# Patient Record
Sex: Female | Born: 1955 | Race: White | Hispanic: Yes | Marital: Married | Smoking: Never smoker
Health system: Southern US, Community
[De-identification: ages and names within clinical notes are randomized; demographics above are authoritative.]

## PROBLEM LIST (undated history)

## (undated) DIAGNOSIS — E119 Type 2 diabetes mellitus without complications: Secondary | ICD-10-CM

## (undated) DIAGNOSIS — Z862 Personal history of diseases of the blood and blood-forming organs and certain disorders involving the immune mechanism: Secondary | ICD-10-CM

## (undated) DIAGNOSIS — E785 Hyperlipidemia, unspecified: Secondary | ICD-10-CM

## (undated) DIAGNOSIS — K319 Disease of stomach and duodenum, unspecified: Secondary | ICD-10-CM

## (undated) DIAGNOSIS — D509 Iron deficiency anemia, unspecified: Secondary | ICD-10-CM

## (undated) DIAGNOSIS — D696 Thrombocytopenia, unspecified: Secondary | ICD-10-CM

## (undated) DIAGNOSIS — I10 Essential (primary) hypertension: Secondary | ICD-10-CM

## (undated) DIAGNOSIS — D649 Anemia, unspecified: Secondary | ICD-10-CM

## (undated) DIAGNOSIS — Z87898 Personal history of other specified conditions: Secondary | ICD-10-CM

## (undated) DIAGNOSIS — Z972 Presence of dental prosthetic device (complete) (partial): Secondary | ICD-10-CM

## (undated) DIAGNOSIS — K259 Gastric ulcer, unspecified as acute or chronic, without hemorrhage or perforation: Secondary | ICD-10-CM

## (undated) HISTORY — DX: Essential (primary) hypertension: I10

## (undated) HISTORY — PX: TOE SURGERY: SHX1073

## (undated) HISTORY — DX: Disease of stomach and duodenum, unspecified: K31.9

## (undated) HISTORY — DX: Gastric ulcer, unspecified as acute or chronic, without hemorrhage or perforation: K25.9

## (undated) HISTORY — DX: Hyperlipidemia, unspecified: E78.5

## (undated) HISTORY — DX: Type 2 diabetes mellitus without complications: E11.9

---

## 2006-02-14 ENCOUNTER — Ambulatory Visit: Payer: Self-pay | Admitting: Family Medicine

## 2006-02-19 ENCOUNTER — Ambulatory Visit: Payer: Self-pay

## 2006-03-06 ENCOUNTER — Ambulatory Visit: Payer: Self-pay | Admitting: *Deleted

## 2006-03-21 ENCOUNTER — Encounter: Payer: Self-pay | Admitting: Family Medicine

## 2006-04-14 ENCOUNTER — Encounter: Payer: Self-pay | Admitting: Family Medicine

## 2006-05-15 ENCOUNTER — Encounter: Payer: Self-pay | Admitting: Family Medicine

## 2006-06-15 ENCOUNTER — Encounter: Payer: Self-pay | Admitting: Family Medicine

## 2006-12-31 ENCOUNTER — Ambulatory Visit: Payer: Self-pay | Admitting: *Deleted

## 2007-01-01 ENCOUNTER — Ambulatory Visit (HOSPITAL_COMMUNITY): Admission: RE | Admit: 2007-01-01 | Discharge: 2007-01-01 | Payer: Self-pay | Admitting: *Deleted

## 2007-01-16 ENCOUNTER — Ambulatory Visit: Payer: Self-pay | Admitting: Cardiovascular Disease

## 2009-11-10 ENCOUNTER — Ambulatory Visit: Payer: Self-pay | Admitting: Family Medicine

## 2012-07-22 ENCOUNTER — Ambulatory Visit: Payer: Self-pay | Admitting: Family Medicine

## 2013-05-14 ENCOUNTER — Ambulatory Visit: Payer: Self-pay | Admitting: Podiatry

## 2013-05-14 ENCOUNTER — Ambulatory Visit: Payer: Self-pay | Admitting: Anesthesiology

## 2013-05-14 DIAGNOSIS — I10 Essential (primary) hypertension: Secondary | ICD-10-CM

## 2014-09-20 DIAGNOSIS — I771 Stricture of artery: Secondary | ICD-10-CM | POA: Insufficient documentation

## 2017-12-09 ENCOUNTER — Other Ambulatory Visit: Payer: Self-pay | Admitting: Family Medicine

## 2017-12-09 DIAGNOSIS — Z1239 Encounter for other screening for malignant neoplasm of breast: Secondary | ICD-10-CM

## 2018-01-03 ENCOUNTER — Ambulatory Visit
Admission: RE | Admit: 2018-01-03 | Discharge: 2018-01-03 | Disposition: A | Discharge status: Home / Self Care | Payer: No Typology Code available for payment source | Source: Ambulatory Visit | Attending: Family Medicine | Admitting: Family Medicine

## 2018-01-03 ENCOUNTER — Encounter: Payer: Self-pay | Admitting: Radiology

## 2018-01-03 DIAGNOSIS — Z1231 Encounter for screening mammogram for malignant neoplasm of breast: Secondary | ICD-10-CM | POA: Insufficient documentation

## 2018-01-03 DIAGNOSIS — Z1239 Encounter for other screening for malignant neoplasm of breast: Secondary | ICD-10-CM

## 2020-06-01 ENCOUNTER — Other Ambulatory Visit: Payer: Self-pay | Admitting: Family Medicine

## 2020-06-01 DIAGNOSIS — Z13820 Encounter for screening for osteoporosis: Secondary | ICD-10-CM

## 2020-06-10 ENCOUNTER — Other Ambulatory Visit: Payer: Self-pay | Admitting: Family Medicine

## 2020-06-10 DIAGNOSIS — G458 Other transient cerebral ischemic attacks and related syndromes: Secondary | ICD-10-CM

## 2020-09-19 ENCOUNTER — Other Ambulatory Visit: Payer: Self-pay | Admitting: Family Medicine

## 2020-09-19 DIAGNOSIS — Z1231 Encounter for screening mammogram for malignant neoplasm of breast: Secondary | ICD-10-CM

## 2020-10-21 ENCOUNTER — Ambulatory Visit
Admission: RE | Admit: 2020-10-21 | Discharge: 2020-10-21 | Disposition: A | Discharge status: Home / Self Care | Payer: Medicare Other | Source: Ambulatory Visit | Attending: Family Medicine | Admitting: Family Medicine

## 2020-10-21 ENCOUNTER — Other Ambulatory Visit: Payer: Self-pay

## 2020-10-21 DIAGNOSIS — Z1231 Encounter for screening mammogram for malignant neoplasm of breast: Secondary | ICD-10-CM | POA: Diagnosis present

## 2021-07-19 ENCOUNTER — Other Ambulatory Visit: Payer: Self-pay | Admitting: Physician Assistant

## 2021-07-19 DIAGNOSIS — Z13820 Encounter for screening for osteoporosis: Secondary | ICD-10-CM

## 2021-09-22 ENCOUNTER — Encounter (INDEPENDENT_AMBULATORY_CARE_PROVIDER_SITE_OTHER): Payer: Self-pay

## 2021-09-22 ENCOUNTER — Inpatient Hospital Stay: Payer: Medicare Other

## 2021-09-22 ENCOUNTER — Inpatient Hospital Stay: Payer: Medicare Other | Attending: Internal Medicine | Admitting: Internal Medicine

## 2021-09-22 ENCOUNTER — Encounter: Payer: Self-pay | Admitting: Internal Medicine

## 2021-09-22 ENCOUNTER — Other Ambulatory Visit: Payer: Self-pay

## 2021-09-22 VITALS — BP 104/59 | HR 72 | Temp 98.7°F | Wt 146.0 lb

## 2021-09-22 DIAGNOSIS — Z7982 Long term (current) use of aspirin: Secondary | ICD-10-CM | POA: Diagnosis not present

## 2021-09-22 DIAGNOSIS — D473 Essential (hemorrhagic) thrombocythemia: Secondary | ICD-10-CM | POA: Insufficient documentation

## 2021-09-22 DIAGNOSIS — Z86718 Personal history of other venous thrombosis and embolism: Secondary | ICD-10-CM | POA: Insufficient documentation

## 2021-09-22 DIAGNOSIS — R161 Splenomegaly, not elsewhere classified: Secondary | ICD-10-CM | POA: Diagnosis not present

## 2021-09-22 DIAGNOSIS — Z7984 Long term (current) use of oral hypoglycemic drugs: Secondary | ICD-10-CM | POA: Diagnosis not present

## 2021-09-22 DIAGNOSIS — D75839 Thrombocytosis, unspecified: Secondary | ICD-10-CM

## 2021-09-22 DIAGNOSIS — Z79899 Other long term (current) drug therapy: Secondary | ICD-10-CM | POA: Insufficient documentation

## 2021-09-22 DIAGNOSIS — E119 Type 2 diabetes mellitus without complications: Secondary | ICD-10-CM | POA: Insufficient documentation

## 2021-09-22 DIAGNOSIS — I10 Essential (primary) hypertension: Secondary | ICD-10-CM | POA: Diagnosis not present

## 2021-09-22 DIAGNOSIS — E785 Hyperlipidemia, unspecified: Secondary | ICD-10-CM | POA: Insufficient documentation

## 2021-09-22 LAB — CBC WITH DIFFERENTIAL/PLATELET
Abs Immature Granulocytes: 0.05 10*3/uL (ref 0.00–0.07)
Basophils Absolute: 0.1 10*3/uL (ref 0.0–0.1)
Basophils Relative: 1 %
Eosinophils Absolute: 0.5 10*3/uL (ref 0.0–0.5)
Eosinophils Relative: 5 %
HCT: 35.5 % — ABNORMAL LOW (ref 36.0–46.0)
Hemoglobin: 11.6 g/dL — ABNORMAL LOW (ref 12.0–15.0)
Immature Granulocytes: 1 %
Lymphocytes Relative: 19 %
Lymphs Abs: 1.7 10*3/uL (ref 0.7–4.0)
MCH: 27.6 pg (ref 26.0–34.0)
MCHC: 32.7 g/dL (ref 30.0–36.0)
MCV: 84.3 fL (ref 80.0–100.0)
Monocytes Absolute: 0.3 10*3/uL (ref 0.1–1.0)
Monocytes Relative: 4 %
Neutro Abs: 6.6 10*3/uL (ref 1.7–7.7)
Neutrophils Relative %: 70 %
Platelets: 773 10*3/uL — ABNORMAL HIGH (ref 150–400)
RBC: 4.21 MIL/uL (ref 3.87–5.11)
RDW: 15.3 % (ref 11.5–15.5)
WBC: 9.3 10*3/uL (ref 4.0–10.5)
nRBC: 0 % (ref 0.0–0.2)

## 2021-09-22 LAB — COMPREHENSIVE METABOLIC PANEL
ALT: 20 U/L (ref 0–44)
AST: 25 U/L (ref 15–41)
Albumin: 4 g/dL (ref 3.5–5.0)
Alkaline Phosphatase: 59 U/L (ref 38–126)
Anion gap: 10 (ref 5–15)
BUN: 20 mg/dL (ref 8–23)
CO2: 29 mmol/L (ref 22–32)
Calcium: 9.2 mg/dL (ref 8.9–10.3)
Chloride: 99 mmol/L (ref 98–111)
Creatinine, Ser: 0.9 mg/dL (ref 0.44–1.00)
GFR, Estimated: >60 mL/min (ref >60)
Glucose, Bld: 167 mg/dL — ABNORMAL HIGH (ref 70–99)
Potassium: 3.4 mmol/L — ABNORMAL LOW (ref 3.5–5.1)
Sodium: 138 mmol/L (ref 135–145)
Total Bilirubin: 0.4 mg/dL (ref 0.3–1.2)
Total Protein: 7.1 g/dL (ref 6.5–8.1)

## 2021-09-22 LAB — TECHNOLOGIST SMEAR REVIEW: Plt Morphology: INCREASED

## 2021-09-22 LAB — LACTATE DEHYDROGENASE: LDH: 190 U/L (ref 98–192)

## 2021-09-22 NOTE — Progress Notes (Signed)
Pt went to see md because she nauseated, no appetite, fever. She went to md and got an atb and she does not know the name of it she is still on it and after a few dates she was feeling better again

## 2021-09-22 NOTE — Assessment & Plan Note (Addendum)
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 Patient is asymptomatic.  Long discussion with the patient regarding potential cause of the abnormal blood counts-including but not limited to myeloproliferative neoplasm/chronic myeloid leukemia.  # For now I  recommend checking CBC;CMP jak 2 BCR ABL; MPL; CALR mutation on the peripheral blood.LDH;  Check peripheral smear. Also discussed regarding bone marrow biopsy with the above workup is inconclusive; however I would prefer not to do a bone marrow unless absolutely needed.  # I discussed the potential concerns for stroke with elevated platelets. Patient is on aspirin.   Thank you for allowing me to participate in the care of your pleasant patient. Please do not hesitate to contact me with questions or concerns in the interim.  # Patient follow-up with me in approximately 3 weeks to review the above results. All questions were answered. The patient knows to call the clinic with any problems, questions or concerns.  # DISPOSITION: # labs today # follow up in 3 weeks- MD; no labs-Dr.B

## 2021-09-22 NOTE — Progress Notes (Signed)
[facility] CONSULT NOTE  Patient Care Team: System, Provider Not In as PCP - General  CHIEF COMPLAINTS/PURPOSE OF CONSULTATION: Thrombocytosis.   HEMATOLOGY HISTORY  # THROMBOCYTOSIS [since 2008- Elevated platelets- 771 [2015]; Hb; white count]; NOV 2022- 810; Hb 12; WBC- WNL [PCP]  # Hx of Left LE DVT [in 9373]-    HISTORY OF PRESENTING ILLNESS: Ambulating independently.  Accompanied by son.  Interpreter. Virginia Esparza 65 y.o.  female pleasant patient was been referred to [place] for further evaluation of elevated platelets which was incidentally found on blood work.   Patient had a recent episode of URI treated with antibiotics.  On work-up patient noted to have elevated platelets up to 800.  Patient denies any history of blood clots or strokes. Denies any burning pain or discoloration in the fingertips or toes. Patient takes aspirin once a day.   Appetite is good . No weight loss or night sweats no recurrent fevers. No cough or shortness of breath or chest pain.     Review of Systems  Constitutional:  Negative for chills, diaphoresis, fever, malaise/fatigue and weight loss.  HENT:  Negative for nosebleeds and sore throat.   Eyes:  Negative for double vision.  Respiratory:  Negative for cough, hemoptysis, sputum production, shortness of breath and wheezing.   Cardiovascular:  Negative for chest pain, palpitations, orthopnea and leg swelling.  Gastrointestinal:  Negative for abdominal pain, blood in stool, constipation, diarrhea, heartburn, melena, nausea and vomiting.  Genitourinary:  Negative for dysuria, frequency and urgency.  Musculoskeletal:  Negative for back pain and joint pain.  Skin: Negative.  Negative for itching and rash.  Neurological:  Negative for dizziness, tingling, focal weakness, weakness and headaches.  Endo/Heme/Allergies:  Does not bruise/bleed easily.  Psychiatric/Behavioral:  Negative for depression. The patient is not nervous/anxious  and does not have insomnia.     MEDICAL HISTORY:  Past Medical History:  Diagnosis Date   Diabetes mellitus without complication (HCC)    prediabetic   Hyperlipemia    Hypertension     SURGICAL HISTORY: Past Surgical History:  Procedure Laterality Date   CESAREAN SECTION  1981   CESAREAN SECTION  1995    SOCIAL HISTORY: Social History   Socioeconomic History   Marital status: Married    Spouse name: Not on file   Number of children: Not on file   Years of education: Not on file   Highest education level: Not on file  Occupational History   Not on file  Tobacco Use   Smoking status: Never   Smokeless tobacco: Never  Vaping Use   Vaping Use: Never used  Substance and Sexual Activity   Alcohol use: Never   Drug use: Never   Sexual activity: Yes  Other Topics Concern   Not on file  Social History 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 Lives in [place]- with husband/son.    Social Determinants of Health   Financial Resource Strain: Not on file  Food Insecurity: Not on file  Transportation Needs: Not on file  Physical Activity: Not on file  Stress: Not on file  Social Connections: Not on file  Intimate Partner Violence: Not on file    FAMILY HISTORY: Family History  Problem Relation Age of Onset   Diabetes Mother     ALLERGIES:  has no allergies on file.  MEDICATIONS:  Current Outpatient Medications  Medication Sig Dispense Refill   aspirin 81 MG EC tablet Take 81 mg by mouth daily.  atorvastatin (LIPITOR) 40 MG tablet Take 40 mg by mouth daily as needed.     chlorthalidone (HYGROTON) 25 MG tablet Take 25 mg by mouth daily.     lisinopril (ZESTRIL) 40 MG tablet Take 40 mg by mouth daily.     metFORMIN (GLUCOPHAGE) 500 MG tablet Take 500 mg by mouth 2 (two) times daily.     No current facility-administered medications for this visit.     PHYSICAL EXAMINATION:   Vitals:   09/22/21 1418  BP: (!) 104/59  Pulse: 72   Temp: 98.7 F (37.1 C)   Filed Weights   09/22/21 1418  Weight: 146 lb (66.2 kg)    Physical Exam Vitals and nursing note reviewed.  HENT:     Head: Normocephalic and atraumatic.     Mouth/Throat:     Pharynx: Oropharynx is clear.  Eyes:     Extraocular Movements: Extraocular movements intact.     Pupils: Pupils are equal, round, and reactive to light.  Cardiovascular:     Rate and Rhythm: Normal rate and regular rhythm.  Pulmonary:     Comments: Decreased breath sounds bilaterally.  Abdominal:     Palpations: Abdomen is soft.  Musculoskeletal:        General: Normal range of motion.     Cervical back: Normal range of motion.  Skin:    General: Skin is warm.  Neurological:     General: No focal deficit present.     Mental Status: She is alert and oriented to person, place, and time.  Psychiatric:        Behavior: Behavior normal.        Judgment: Judgment normal.     LABORATORY DATA:  I have reviewed the data as listed Lab Results  Component Value Date   WBC 9.3 09/22/2021   HGB 11.6 (L) 09/22/2021   HCT 35.5 (L) 09/22/2021   MCV 84.3 09/22/2021   PLT 773 (H) 09/22/2021   Recent Labs    09/22/21 1515  NA 138  K 3.4*  CL 99  CO2 29  GLUCOSE 167*  BUN 20  CREATININE 0.90  CALCIUM 9.2  GFRNONAA >60  PROT 7.1  ALBUMIN 4.0  AST 25  ALT 20  ALKPHOS 59  BILITOT 0.4     No results found.  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 Patient is asymptomatic.  Long discussion with the patient regarding potential cause of the abnormal blood counts-including but not limited to myeloproliferative neoplasm/chronic myeloid leukemia.  # For now I  recommend checking CBC;CMP jak 2 BCR ABL; MPL; CALR mutation on the peripheral blood.LDH;  Check peripheral smear. Also discussed regarding bone marrow biopsy with the above workup is inconclusive; however I would prefer  not to do a bone marrow unless absolutely needed.  # I discussed the potential concerns for stroke with elevated platelets. Patient is on aspirin.   Thank you for allowing me to participate in the care of your pleasant patient. Please do not hesitate to contact me with questions or concerns in the interim.  # Patient follow-up with me in approximately 3 weeks to review the above results. All questions were answered. The patient knows to call the clinic with any problems, questions or concerns.  # DISPOSITION: # labs today # follow up in 3 weeks- MD; no labs-Dr.B       Cammie Sickle, MD 09/22/2021 8:12 PM

## 2021-09-25 LAB — BCR-ABL1 FISH
Cells Analyzed: 200
Cells Counted: 200

## 2021-09-28 LAB — CALRETICULIN (CALR) MUTATION ANALYSIS

## 2021-09-29 ENCOUNTER — Ambulatory Visit: Payer: Medicare Other

## 2021-09-29 LAB — JAK2 GENOTYPR

## 2021-10-03 LAB — MPL MUTATION ANALYSIS

## 2021-10-13 ENCOUNTER — Ambulatory Visit: Payer: Medicare Other | Admitting: Internal Medicine

## 2021-10-18 ENCOUNTER — Other Ambulatory Visit: Payer: Self-pay

## 2021-10-18 ENCOUNTER — Ambulatory Visit
Admission: RE | Admit: 2021-10-18 | Discharge: 2021-10-18 | Disposition: A | Discharge status: Home / Self Care | Payer: Medicare Other | Source: Ambulatory Visit | Attending: Internal Medicine | Admitting: Internal Medicine

## 2021-10-18 DIAGNOSIS — D75839 Thrombocytosis, unspecified: Secondary | ICD-10-CM | POA: Insufficient documentation

## 2021-10-18 DIAGNOSIS — R161 Splenomegaly, not elsewhere classified: Secondary | ICD-10-CM | POA: Insufficient documentation

## 2021-10-20 ENCOUNTER — Inpatient Hospital Stay: Payer: Medicare Other | Attending: Internal Medicine | Admitting: Internal Medicine

## 2021-10-20 ENCOUNTER — Ambulatory Visit: Payer: Medicare Other | Admitting: Internal Medicine

## 2021-10-20 ENCOUNTER — Encounter: Payer: Self-pay | Admitting: Internal Medicine

## 2021-10-20 ENCOUNTER — Other Ambulatory Visit: Payer: Self-pay

## 2021-10-20 DIAGNOSIS — Z7984 Long term (current) use of oral hypoglycemic drugs: Secondary | ICD-10-CM | POA: Diagnosis not present

## 2021-10-20 DIAGNOSIS — E785 Hyperlipidemia, unspecified: Secondary | ICD-10-CM | POA: Insufficient documentation

## 2021-10-20 DIAGNOSIS — I10 Essential (primary) hypertension: Secondary | ICD-10-CM | POA: Diagnosis not present

## 2021-10-20 DIAGNOSIS — Z86718 Personal history of other venous thrombosis and embolism: Secondary | ICD-10-CM | POA: Diagnosis not present

## 2021-10-20 DIAGNOSIS — Z79899 Other long term (current) drug therapy: Secondary | ICD-10-CM | POA: Diagnosis not present

## 2021-10-20 DIAGNOSIS — R161 Splenomegaly, not elsewhere classified: Secondary | ICD-10-CM | POA: Diagnosis not present

## 2021-10-20 DIAGNOSIS — Z7982 Long term (current) use of aspirin: Secondary | ICD-10-CM | POA: Insufficient documentation

## 2021-10-20 DIAGNOSIS — D473 Essential (hemorrhagic) thrombocythemia: Secondary | ICD-10-CM | POA: Insufficient documentation

## 2021-10-20 DIAGNOSIS — E119 Type 2 diabetes mellitus without complications: Secondary | ICD-10-CM | POA: Insufficient documentation

## 2021-10-20 MED ORDER — HYDROXYUREA 500 MG PO CAPS: 500.0000 mg | ORAL_CAPSULE | Freq: Every day | ORAL | 3 refills | Status: DC

## 2021-10-20 NOTE — Progress Notes (Signed)
[facility] CONSULT NOTE  Patient Care Team: System, Provider Not In as PCP - General  CHIEF COMPLAINTS/PURPOSE OF CONSULTATION: Essential thrombocytosis.   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   # Hx of Left LE DVT [in 6283]-    Essential thrombocythemia ([place])  09/22/2021 Initial Diagnosis   Essential thrombocythemia ([place])       HISTORY OF PRESENTING ILLNESS: Ambulating independently.  Accompanied with granddaughter; Interpreter.  Virginia Esparza 66 y.o.  female pleasant patient with history of thrombocytosis is here today with results of the blood work.  Patient denies any new symptoms since last visit.    Review of Systems  Constitutional:  Negative for chills, diaphoresis, fever, malaise/fatigue and weight loss.  HENT:  Negative for nosebleeds and sore throat.   Eyes:  Negative for double vision.  Respiratory:  Negative for cough, hemoptysis, sputum production, shortness of breath and wheezing.   Cardiovascular:  Negative for chest pain, palpitations, orthopnea and leg swelling.  Gastrointestinal:  Negative for abdominal pain, blood in stool, constipation, diarrhea, heartburn, melena, nausea and vomiting.  Genitourinary:  Negative for dysuria, frequency and urgency.  Musculoskeletal:  Negative for back pain and joint pain.  Skin: Negative.  Negative for itching and rash.  Neurological:  Negative for dizziness, tingling, focal weakness, weakness and headaches.  Endo/Heme/Allergies:  Does not bruise/bleed easily.  Psychiatric/Behavioral:  Negative for depression. The patient is not nervous/anxious and does not have insomnia.     MEDICAL HISTORY:  Past Medical History:  Diagnosis Date   Diabetes mellitus without complication (HCC)     prediabetic   Hyperlipemia    Hypertension     SURGICAL HISTORY: Past Surgical History:  Procedure Laterality Date   CESAREAN SECTION  1981   CESAREAN SECTION  1995    SOCIAL HISTORY: Social History   Socioeconomic History   Marital status: Married    Spouse name: Not on file   Number of children: Not on file   Years of education: Not on file   Highest education level: Not on file  Occupational History   Not on file  Tobacco Use   Smoking status: Never   Smokeless tobacco: Never  Vaping Use   Vaping Use: Never used  Substance and Sexual Activity   Alcohol use: Never   Drug use: Never   Sexual activity: Yes  Other Topics Concern   Not on file  Social History 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    Social Determinants of Health   Financial Resource Strain: Not on file  Food Insecurity: Not on file  Transportation Needs: Not on file  Physical Activity: Not on file  Stress: Not on file  Social Connections: Not on file  Intimate Partner Violence: Not on file    FAMILY HISTORY: Family History  Problem Relation Age of Onset   Diabetes Mother     ALLERGIES:  has no allergies on file.  MEDICATIONS:  Current Outpatient Medications  Medication Sig Dispense Refill   aspirin 81 MG EC tablet Take 81 mg by mouth daily.     atorvastatin (LIPITOR) 40 MG tablet Take 40 mg by mouth daily as needed.     chlorthalidone (HYGROTON) 25 MG tablet Take 25 mg by mouth daily.  hydroxyurea (HYDREA) 500 MG capsule Take 1 capsule (500 mg total) by mouth daily. May take with food to minimize GI side effects. 30 capsule 3   lisinopril (ZESTRIL) 40 MG tablet Take 40 mg by mouth daily.     metFORMIN (GLUCOPHAGE) 500 MG tablet Take 500 mg by mouth 2 (two) times daily.     No current facility-administered medications for this visit.     PHYSICAL EXAMINATION:   Vitals:   10/20/21 1459  BP: 121/79  Pulse: 74   Temp: (!) 97.1 F (36.2 C)  SpO2: 100%   Filed Weights   10/20/21 1459  Weight: 143 lb (64.9 kg)    Physical Exam Vitals and nursing note reviewed.  HENT:     Head: Normocephalic and atraumatic.     Mouth/Throat:     Pharynx: Oropharynx is clear.  Eyes:     Extraocular Movements: Extraocular movements intact.     Pupils: Pupils are equal, round, and reactive to light.  Cardiovascular:     Rate and Rhythm: Normal rate and regular rhythm.  Pulmonary:     Comments: Decreased breath sounds bilaterally.  Abdominal:     Palpations: Abdomen is soft.  Musculoskeletal:        General: Normal range of motion.     Cervical back: Normal range of motion.  Skin:    General: Skin is warm.  Neurological:     General: No focal deficit present.     Mental Status: She is alert and oriented to person, place, and time.  Psychiatric:        Behavior: Behavior normal.        Judgment: Judgment normal.     LABORATORY DATA:  I have reviewed the data as listed Lab Results  Component Value Date   WBC 9.3 09/22/2021   HGB 11.6 (L) 09/22/2021   HCT 35.5 (L) 09/22/2021   MCV 84.3 09/22/2021   PLT 773 (H) 09/22/2021   Recent Labs    09/22/21 1515  NA 138  K 3.4*  CL 99  CO2 29  GLUCOSE 167*  BUN 20  CREATININE 0.90  CALCIUM 9.2  GFRNONAA >60  PROT 7.1  ALBUMIN 4.0  AST 25  ALT 20  ALKPHOS 59  BILITOT 0.4     US SPLEEN (ABDOMEN LIMITED)  Result Date: 10/18/2021 CLINICAL DATA:  Thrombocytosis.  Splenomegaly. EXAM: ULTRASOUND ABDOMEN LIMITED COMPARISON:  None available. FINDINGS: The spleen measures 12.6 x 12.2 x 4.9 cm. Volume equals 467 mL. Smooth splenic contours. No focal lesion is seen. The splenic artery and vein demonstrate normal color flow vascularity. IMPRESSION: Mild splenomegaly. 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 [place]- mild  splenomegaly. Start hydrea 500 mg/day.  I reviewed with the patient and family regarding the significance of myeloproliferative neoplasm/essential thrombocytosis.  Discussed that although it this is termed as malignancy-fortunately life expectancy is usually not any different compared to general population.  #.  Recommend Hydrea 500 mg once a day to get on the risk of stroke/vascular events.  Discussed the potential side effects of Hydrea including but not limited to nausea vomiting diarrhea/skin rash etc. which again extremely rare at a small dose of 500mg .  Patient will continue aspirin.  # DISPOSITION: # follow up in 2 months MD;  labs-cbc/cmp/ iron studies/ferritin-Dr.B       Cammie Sickle, MD 10/20/2021 4:10 PM

## 2021-10-20 NOTE — Assessment & Plan Note (Addendum)
#   Essential thrombocytosis- JAK-2 POSITIVE. [place]- mild splenomegaly. Start hydrea 500 mg/day.  I reviewed with the patient and family regarding the significance of myeloproliferative neoplasm/essential thrombocytosis.  Discussed that although it this is termed as malignancy-fortunately life expectancy is usually not any different compared to general population.  #.  Recommend Hydrea 500 mg once a day to get on the risk of stroke/vascular events.  Discussed the potential side effects of Hydrea including but not limited to nausea vomiting diarrhea/skin rash etc. which again extremely rare at a small dose of 500mg .  Patient will continue aspirin.  # DISPOSITION: # follow up in 2 months MD;  labs-cbc/cmp/ iron studies/ferritin-Dr.B

## 2021-12-12 ENCOUNTER — Other Ambulatory Visit: Payer: Self-pay | Admitting: *Deleted

## 2021-12-12 DIAGNOSIS — D473 Essential (hemorrhagic) thrombocythemia: Secondary | ICD-10-CM

## 2021-12-18 ENCOUNTER — Inpatient Hospital Stay (HOSPITAL_BASED_OUTPATIENT_CLINIC_OR_DEPARTMENT_OTHER): Payer: Medicare Other | Admitting: Internal Medicine

## 2021-12-18 ENCOUNTER — Encounter (INDEPENDENT_AMBULATORY_CARE_PROVIDER_SITE_OTHER): Payer: Self-pay

## 2021-12-18 ENCOUNTER — Inpatient Hospital Stay: Payer: Medicare Other | Attending: Internal Medicine

## 2021-12-18 ENCOUNTER — Encounter: Payer: Self-pay | Admitting: Internal Medicine

## 2021-12-18 ENCOUNTER — Other Ambulatory Visit: Payer: Self-pay

## 2021-12-18 VITALS — BP 130/77 | HR 76 | Temp 98.7°F | Resp 20 | Wt 139.5 lb

## 2021-12-18 DIAGNOSIS — Z86718 Personal history of other venous thrombosis and embolism: Secondary | ICD-10-CM | POA: Insufficient documentation

## 2021-12-18 DIAGNOSIS — D473 Essential (hemorrhagic) thrombocythemia: Secondary | ICD-10-CM | POA: Diagnosis present

## 2021-12-18 DIAGNOSIS — M25511 Pain in right shoulder: Secondary | ICD-10-CM

## 2021-12-18 DIAGNOSIS — I10 Essential (primary) hypertension: Secondary | ICD-10-CM | POA: Insufficient documentation

## 2021-12-18 DIAGNOSIS — Z7984 Long term (current) use of oral hypoglycemic drugs: Secondary | ICD-10-CM | POA: Insufficient documentation

## 2021-12-18 DIAGNOSIS — Z79899 Other long term (current) drug therapy: Secondary | ICD-10-CM | POA: Diagnosis not present

## 2021-12-18 DIAGNOSIS — Z7982 Long term (current) use of aspirin: Secondary | ICD-10-CM | POA: Insufficient documentation

## 2021-12-18 DIAGNOSIS — R197 Diarrhea, unspecified: Secondary | ICD-10-CM | POA: Insufficient documentation

## 2021-12-18 DIAGNOSIS — E119 Type 2 diabetes mellitus without complications: Secondary | ICD-10-CM | POA: Diagnosis not present

## 2021-12-18 LAB — CBC WITH DIFFERENTIAL/PLATELET
Abs Immature Granulocytes: 0.02 10*3/uL (ref 0.00–0.07)
Basophils Absolute: 0.1 10*3/uL (ref 0.0–0.1)
Basophils Relative: 1 %
Eosinophils Absolute: 0.3 10*3/uL (ref 0.0–0.5)
Eosinophils Relative: 4 %
HCT: 37.5 % (ref 36.0–46.0)
Hemoglobin: 12.2 g/dL (ref 12.0–15.0)
Immature Granulocytes: 0 %
Lymphocytes Relative: 23 %
Lymphs Abs: 1.8 10*3/uL (ref 0.7–4.0)
MCH: 27.9 pg (ref 26.0–34.0)
MCHC: 32.5 g/dL (ref 30.0–36.0)
MCV: 85.8 fL (ref 80.0–100.0)
Monocytes Absolute: 0.3 10*3/uL (ref 0.1–1.0)
Monocytes Relative: 4 %
Neutro Abs: 5.2 10*3/uL (ref 1.7–7.7)
Neutrophils Relative %: 68 %
Platelets: 554 10*3/uL — ABNORMAL HIGH (ref 150–400)
RBC: 4.37 MIL/uL (ref 3.87–5.11)
RDW: 17.2 % — ABNORMAL HIGH (ref 11.5–15.5)
WBC: 7.7 10*3/uL (ref 4.0–10.5)
nRBC: 0 % (ref 0.0–0.2)

## 2021-12-18 LAB — COMPREHENSIVE METABOLIC PANEL
ALT: 19 U/L (ref 0–44)
AST: 21 U/L (ref 15–41)
Albumin: 3.8 g/dL (ref 3.5–5.0)
Alkaline Phosphatase: 52 U/L (ref 38–126)
Anion gap: 8 (ref 5–15)
BUN: 22 mg/dL (ref 8–23)
CO2: 28 mmol/L (ref 22–32)
Calcium: 9.2 mg/dL (ref 8.9–10.3)
Chloride: 101 mmol/L (ref 98–111)
Creatinine, Ser: 0.84 mg/dL (ref 0.44–1.00)
GFR, Estimated: >60 mL/min (ref >60)
Glucose, Bld: 106 mg/dL — ABNORMAL HIGH (ref 70–99)
Potassium: 3.5 mmol/L (ref 3.5–5.1)
Sodium: 137 mmol/L (ref 135–145)
Total Bilirubin: 0.3 mg/dL (ref 0.3–1.2)
Total Protein: 7.2 g/dL (ref 6.5–8.1)

## 2021-12-18 LAB — FERRITIN: Ferritin: 23 ng/mL (ref 11–307)

## 2021-12-18 LAB — IRON AND TIBC
Iron: 53 ug/dL (ref 28–170)
Saturation Ratios: 18 % (ref 10.4–31.8)
TIBC: 295 ug/dL (ref 250–450)
UIBC: 242 ug/dL

## 2021-12-18 MED ORDER — HYDROXYUREA 500 MG PO CAPS: 500.0000 mg | ORAL_CAPSULE | Freq: Every day | ORAL | 3 refills | Status: DC

## 2021-12-18 NOTE — Assessment & Plan Note (Addendum)
#   Essential thrombocytosis- JAK-2 POSITIVE. [place]- mild splenomegaly.  Currently on hydrea 500 mg/day [started Jan 2023].  Today platelets are 550.  Continue Hydrea at current dose.  I do not suspect patient's shoulder pain is related to Hydrea.[see below] ? 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 Try to take PRN Tyelenol ? ?# Diarrhea- G-1 sec to Hydrea- monitor for now.  ? ?grand-daughter: Rosa- Ph: [phone] will call with results of x-ray ? ?# DISPOSITION:  ?# X-ray of shoulder today ?# [facility] re: right shoulder pain ?# follow up in 3 months MD;  labs-cbc/cmp-Dr.B ?

## 2021-12-18 NOTE — Patient Instructions (Signed)
#   X-ray of shoulder today ?

## 2021-12-18 NOTE — Progress Notes (Signed)
Patient states she is having pain in right arm and upper right back.  ?

## 2021-12-18 NOTE — Progress Notes (Signed)
[facility] ?CONSULT NOTE ? ?Patient Care Team: ?System, Provider Not In as PCP - General ? ?CHIEF COMPLAINTS/PURPOSE OF CONSULTATION: Essential thrombocytosis. ? ? ?HEMATOLOGY HISTORY ? ?Oncology History Overview Note  ?# THROMBOCYTOSIS [since 2008- Elevated platelets- 771 [2015]; Hb; white count]; NOV 2022- 810; Hb 12; WBC- WNL [PCP]-  ? 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  ? ?# Hx of Left LE DVT [in 8341]-  ?  ?Essential thrombocythemia ([place])  ?09/22/2021 Initial Diagnosis  ? Essential thrombocythemia ([place]) ?  ? ? ? ? ?HISTORY OF PRESENTING ILLNESS: Ambulating independently.  Accompanied with granddaughter; Interpreter. ? ?Virginia Esparza 66 y.o.  female pleasant patient with history of JAK-2 positive ET on hydrea is here for follow-up. ? ?Patient complains of right shoulder pain especially with elevation the last 2 months also.  Patient states that the pain started approximately 2 weeks after starting Hydrea.  Mild diarrhea 1-2 loose stools every few days.  Otherwise no abdominal pain nausea vomiting. ? ? ? ?Review of Systems  ?Constitutional:  Negative for chills, diaphoresis, fever, malaise/fatigue and weight loss.  ?HENT:  Negative for nosebleeds and sore throat.   ?Eyes:  Negative for double vision.  ?Respiratory:  Negative for cough, hemoptysis, sputum production, shortness of breath and wheezing.   ?Cardiovascular:  Negative for chest pain, palpitations, orthopnea and leg swelling.  ?Gastrointestinal:  Negative for abdominal pain, blood in stool, constipation, diarrhea, heartburn, melena, nausea and vomiting.  ?Genitourinary:  Negative for dysuria, frequency and urgency.  ?Musculoskeletal:  Positive for joint pain. Negative for back pain.  ?Skin: Negative.  Negative for itching and rash.  ?Neurological:  Negative for dizziness, tingling, focal weakness, weakness and headaches.  ?Endo/Heme/Allergies:  Does not bruise/bleed easily.   ?Psychiatric/Behavioral:  Negative for depression. The patient is not nervous/anxious and does not have insomnia.   ? ? ?MEDICAL HISTORY:  ?Past Medical History:  ?Diagnosis Date  ? Diabetes mellitus without complication ([place])   ? prediabetic  ? Hyperlipemia   ? Hypertension   ? ? ?SURGICAL HISTORY: ?Past Surgical History:  ?Procedure Laterality Date  ? [place]  ? [place]  ? ? ?SOCIAL HISTORY: ?Social History  ? ?Socioeconomic History  ? Marital status: Married  ?  Spouse name: Not on file  ? Number of children: Not on file  ? Years of education: Not on file  ? Highest education level: Not on file  ?Occupational History  ? Not on file  ?Tobacco Use  ? Smoking status: Never  ? Smokeless tobacco: Never  ?Vaping Use  ? Vaping Use: Never used  ?Substance and Sexual Activity  ? Alcohol use: Never  ? Drug use: Never  ? Sexual activity: Yes  ?Other Topics Concern  ? Not on file  ?Social History Narrative  ? 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   ? ?Social Determinants of Health  ? ?Financial Resource Strain: Not on file  ?Food Insecurity: Not on file  ?Transportation Needs: Not on file  ?Physical Activity: Not on file  ?Stress: Not on file  ?Social Connections: Not on file  ?Intimate Partner Violence: Not on file  ? ? ?FAMILY HISTORY: ?Family History  ?Problem Relation Age of Onset  ? Diabetes Mother   ? ? ?ALLERGIES:  has no allergies on file. ? ?MEDICATIONS:  ?Current Outpatient Medications  ?Medication Sig Dispense Refill  ? aspirin 81 MG EC tablet Take 81 mg by  mouth daily.    ? atorvastatin (LIPITOR) 40 MG tablet Take 40 mg by mouth daily as needed.    ? chlorthalidone (HYGROTON) 25 MG tablet Take 25 mg by mouth daily.    ? lisinopril (ZESTRIL) 40 MG tablet Take 40 mg by mouth daily.    ? metFORMIN (GLUCOPHAGE) 500 MG tablet Take 500 mg by mouth 2 (two) times daily.    ? hydroxyurea (HYDREA) 500 MG capsule Take 1 capsule (500 mg total) by  mouth daily. May take with food to minimize GI side effects. 30 capsule 3  ? ?No current facility-administered medications for this visit.  ? ?  ?PHYSICAL EXAMINATION: ? ? ?Vitals:  ? 12/18/21 1037  ?BP: 130/77  ?Pulse: 76  ?Resp: 20  ?Temp: 98.7 ?F (37.1 ?C)  ?SpO2: 100%  ? ?Filed Weights  ? 12/18/21 1037  ?Weight: 139 lb 8 oz (63.3 kg)  ? ? ?Physical Exam ?Vitals and nursing note reviewed.  ?HENT:  ?   Head: Normocephalic and atraumatic.  ?   Mouth/Throat:  ?   Pharynx: Oropharynx is clear.  ?Eyes:  ?   Extraocular Movements: Extraocular movements intact.  ?   Pupils: Pupils are equal, round, and reactive to light.  ?Cardiovascular:  ?   Rate and Rhythm: Normal rate and regular rhythm.  ?Pulmonary:  ?   Comments: Decreased breath sounds bilaterally.  ?Abdominal:  ?   Palpations: Abdomen is soft.  ?Musculoskeletal:     ?   General: Normal range of motion.  ?   Cervical back: Normal range of motion.  ?Skin: ?   General: Skin is warm.  ?Neurological:  ?   General: No focal deficit present.  ?   Mental Status: She is alert and oriented to person, place, and time.  ?Psychiatric:     ?   Behavior: Behavior normal.     ?   Judgment: Judgment normal.  ? ? ? ?LABORATORY DATA:  ?I have reviewed the data as listed ?Lab Results  ?Component Value Date  ? WBC 7.7 12/18/2021  ? HGB 12.2 12/18/2021  ? HCT 37.5 12/18/2021  ? MCV 85.8 12/18/2021  ? PLT 554 (H) 12/18/2021  ? ?Recent Labs  ?  09/22/21 ?1515 12/18/21 ?0951  ?NA 138 137  ?K 3.4* 3.5  ?CL 99 101  ?CO2 29 28  ?GLUCOSE 167* 106*  ?BUN 20 22  ?CREATININE 0.90 0.84  ?CALCIUM 9.2 9.2  ?GFRNONAA >60 >60  ?PROT 7.1 7.2  ?ALBUMIN 4.0 3.8  ?AST 25 21  ?ALT 20 19  ?ALKPHOS 59 52  ?BILITOT 0.4 0.3  ? ? ? ?No results found. ? ?ASSESSMENT & PLAN:  ? ?Essential thrombocythemia ([place]) ?# Essential thrombocytosis- JAK-2 POSITIVE. [place]- mild splenomegaly.  Currently on hydrea 500 mg/day [started Jan 2023].  Today platelets are 550.  Continue Hydrea at current dose.  I do not suspect  patient's shoulder pain is related to Hydrea.[see below] ? 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 Try to take PRN Tyelenol ? ?# Diarrhea- G-1 sec to Hydrea- monitor for now.  ? ?grand-daughter: Rosa- Ph: [phone] will call with results of x-ray ? ?# DISPOSITION:  ?# X-ray of shoulder today ?# [facility] re: right shoulder pain ?# follow up in 3 months MD;  labs-cbc/cmp-Dr.B ? ? ? ?  ? Cammie Sickle, MD ?12/18/2021 1:03 PM ? ?

## 2022-01-04 ENCOUNTER — Other Ambulatory Visit: Payer: Self-pay | Admitting: Physician Assistant

## 2022-01-04 DIAGNOSIS — Z1231 Encounter for screening mammogram for malignant neoplasm of breast: Secondary | ICD-10-CM

## 2022-01-04 DIAGNOSIS — Z13820 Encounter for screening for osteoporosis: Secondary | ICD-10-CM

## 2022-02-26 ENCOUNTER — Ambulatory Visit
Admission: RE | Admit: 2022-02-26 | Discharge: 2022-02-26 | Disposition: A | Discharge status: Home / Self Care | Payer: Medicare Other | Source: Ambulatory Visit | Attending: Physician Assistant | Admitting: Physician Assistant

## 2022-02-26 ENCOUNTER — Ambulatory Visit: Payer: Medicare Other

## 2022-02-26 DIAGNOSIS — Z1231 Encounter for screening mammogram for malignant neoplasm of breast: Secondary | ICD-10-CM | POA: Insufficient documentation

## 2022-03-19 ENCOUNTER — Inpatient Hospital Stay: Payer: Medicare Other | Attending: Internal Medicine

## 2022-03-19 ENCOUNTER — Inpatient Hospital Stay (HOSPITAL_BASED_OUTPATIENT_CLINIC_OR_DEPARTMENT_OTHER): Payer: Medicare Other | Admitting: Internal Medicine

## 2022-03-19 ENCOUNTER — Other Ambulatory Visit: Payer: Self-pay

## 2022-03-19 ENCOUNTER — Encounter: Payer: Self-pay | Admitting: Internal Medicine

## 2022-03-19 DIAGNOSIS — D75839 Thrombocytosis, unspecified: Secondary | ICD-10-CM | POA: Diagnosis not present

## 2022-03-19 DIAGNOSIS — I10 Essential (primary) hypertension: Secondary | ICD-10-CM | POA: Diagnosis not present

## 2022-03-19 DIAGNOSIS — M25511 Pain in right shoulder: Secondary | ICD-10-CM | POA: Insufficient documentation

## 2022-03-19 DIAGNOSIS — R161 Splenomegaly, not elsewhere classified: Secondary | ICD-10-CM | POA: Insufficient documentation

## 2022-03-19 DIAGNOSIS — D473 Essential (hemorrhagic) thrombocythemia: Secondary | ICD-10-CM

## 2022-03-19 DIAGNOSIS — E119 Type 2 diabetes mellitus without complications: Secondary | ICD-10-CM | POA: Diagnosis not present

## 2022-03-19 DIAGNOSIS — D649 Anemia, unspecified: Secondary | ICD-10-CM | POA: Insufficient documentation

## 2022-03-19 DIAGNOSIS — R197 Diarrhea, unspecified: Secondary | ICD-10-CM | POA: Insufficient documentation

## 2022-03-19 DIAGNOSIS — Z86718 Personal history of other venous thrombosis and embolism: Secondary | ICD-10-CM | POA: Insufficient documentation

## 2022-03-19 DIAGNOSIS — E785 Hyperlipidemia, unspecified: Secondary | ICD-10-CM | POA: Insufficient documentation

## 2022-03-19 LAB — IRON AND TIBC
Iron: 46 ug/dL (ref 28–170)
Saturation Ratios: 15 % (ref 10.4–31.8)
TIBC: 312 ug/dL (ref 250–450)
UIBC: 266 ug/dL

## 2022-03-19 LAB — COMPREHENSIVE METABOLIC PANEL
ALT: 17 U/L (ref 0–44)
AST: 19 U/L (ref 15–41)
Albumin: 3.7 g/dL (ref 3.5–5.0)
Alkaline Phosphatase: 47 U/L (ref 38–126)
Anion gap: 4 — ABNORMAL LOW (ref 5–15)
BUN: 23 mg/dL (ref 8–23)
CO2: 30 mmol/L (ref 22–32)
Calcium: 9 mg/dL (ref 8.9–10.3)
Chloride: 105 mmol/L (ref 98–111)
Creatinine, Ser: 0.93 mg/dL (ref 0.44–1.00)
GFR, Estimated: >60 mL/min (ref >60)
Glucose, Bld: 102 mg/dL — ABNORMAL HIGH (ref 70–99)
Potassium: 3.8 mmol/L (ref 3.5–5.1)
Sodium: 139 mmol/L (ref 135–145)
Total Bilirubin: 0.6 mg/dL (ref 0.3–1.2)
Total Protein: 7.1 g/dL (ref 6.5–8.1)

## 2022-03-19 LAB — CBC WITH DIFFERENTIAL/PLATELET
Abs Immature Granulocytes: 0.02 10*3/uL (ref 0.00–0.07)
Basophils Absolute: 0.1 10*3/uL (ref 0.0–0.1)
Basophils Relative: 1 %
Eosinophils Absolute: 0.3 10*3/uL (ref 0.0–0.5)
Eosinophils Relative: 4 %
HCT: 35.3 % — ABNORMAL LOW (ref 36.0–46.0)
Hemoglobin: 11.4 g/dL — ABNORMAL LOW (ref 12.0–15.0)
Immature Granulocytes: 0 %
Lymphocytes Relative: 23 %
Lymphs Abs: 1.5 10*3/uL (ref 0.7–4.0)
MCH: 30.6 pg (ref 26.0–34.0)
MCHC: 32.3 g/dL (ref 30.0–36.0)
MCV: 94.9 fL (ref 80.0–100.0)
Monocytes Absolute: 0.3 10*3/uL (ref 0.1–1.0)
Monocytes Relative: 5 %
Neutro Abs: 4.5 10*3/uL (ref 1.7–7.7)
Neutrophils Relative %: 67 %
Platelets: 487 10*3/uL — ABNORMAL HIGH (ref 150–400)
RBC: 3.72 MIL/uL — ABNORMAL LOW (ref 3.87–5.11)
RDW: 15.2 % (ref 11.5–15.5)
WBC: 6.7 10*3/uL (ref 4.0–10.5)
nRBC: 0 % (ref 0.0–0.2)

## 2022-03-19 LAB — FERRITIN: Ferritin: 15 ng/mL (ref 11–307)

## 2022-03-19 MED ORDER — HYDROXYUREA 500 MG PO CAPS: 500.0000 mg | ORAL_CAPSULE | Freq: Every day | ORAL | 3 refills | Status: DC

## 2022-03-19 NOTE — Progress Notes (Signed)
[place] NOTE  Patient Care Team: Ane Payment, PA as PCP - General (Physician Assistant) Cammie Sickle, MD as Consulting Physician (Oncology)  CHIEF COMPLAINTS/PURPOSE OF CONSULTATION: Essential thrombocytosis.   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   # Hx of Left LE DVT [in 3785]-    Essential thrombocythemia ([place])  09/22/2021 Initial Diagnosis   Essential thrombocythemia ([place])       HISTORY OF PRESENTING ILLNESS: Ambulating independently.  Accompanied with granddaughter; Interpreter.  Virginia Esparza 66 y.o.  female pleasant patient with history of JAK-2 positive ET on hydrea is here for follow-up.  Patient denies any nausea vomiting.  Any fevers or chills.  Denies any weight loss.  Mild loose stools.  In the interim status post evaluation with orthopedics for the shoulder pain.  Currently improved.  Review of Systems  Constitutional:  Negative for chills, diaphoresis, fever, malaise/fatigue and weight loss.  HENT:  Negative for nosebleeds and sore throat.   Eyes:  Negative for double vision.  Respiratory:  Negative for cough, hemoptysis, sputum production, shortness of breath and wheezing.   Cardiovascular:  Negative for chest pain, palpitations, orthopnea and leg swelling.  Gastrointestinal:  Negative for abdominal pain, blood in stool, constipation, diarrhea, heartburn, melena, nausea and vomiting.  Genitourinary:  Negative for dysuria, frequency and urgency.  Musculoskeletal:  Positive for joint pain. Negative for back pain.  Skin: Negative.  Negative for itching and rash.  Neurological:  Negative for dizziness, tingling, focal weakness, weakness and headaches.  Endo/Heme/Allergies:  Does not  bruise/bleed easily.  Psychiatric/Behavioral:  Negative for depression. The patient is not nervous/anxious and does not have insomnia.     MEDICAL HISTORY:  Past Medical History:  Diagnosis Date   Diabetes mellitus without complication (HCC)    prediabetic   Hyperlipemia    Hypertension     SURGICAL HISTORY: Past Surgical History:  Procedure Laterality Date   CESAREAN SECTION  1981   CESAREAN SECTION  1995    SOCIAL HISTORY: Social History   Socioeconomic History   Marital status: Married    Spouse name: Not on file   Number of children: Not on file   Years of education: Not on file   Highest education level: Not on file  Occupational History   Not on file  Tobacco Use   Smoking status: Never   Smokeless tobacco: Never  Vaping Use   Vaping Use: Never used  Substance and Sexual Activity   Alcohol use: Never   Drug use: Never   Sexual activity: Yes  Other Topics Concern   Not on file  Social History 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 Lives in [place]- with husband/son.    Social Determinants of Health   Financial Resource Strain: Not on file  Food Insecurity: Not on file  Transportation Needs: Not on file  Physical Activity: Not on file  Stress: Not on file  Social Connections: Not on file  Intimate Partner Violence: Not on file    FAMILY HISTORY: Family History  Problem Relation Age of Onset   Diabetes Mother     ALLERGIES:  has no allergies on file.  MEDICATIONS:  Current Outpatient Medications  Medication Sig Dispense Refill   aspirin 81 MG EC tablet Take 81 mg by  mouth daily.     atorvastatin (LIPITOR) 40 MG tablet Take 40 mg by mouth daily as needed.     chlorthalidone (HYGROTON) 25 MG tablet Take 25 mg by mouth daily.     lisinopril (ZESTRIL) 40 MG tablet Take 40 mg by mouth daily.     meloxicam (MOBIC) 15 MG tablet Take 15 mg by mouth daily. Take 1 tablet by mouth for shoulder pain.     metFORMIN (GLUCOPHAGE) 500  MG tablet Take 500 mg by mouth 2 (two) times daily.     hydroxyurea (HYDREA) 500 MG capsule Take 1 capsule (500 mg total) by mouth daily. May take with food to minimize GI side effects. 30 capsule 3   No current facility-administered medications for this visit.     PHYSICAL EXAMINATION:   Vitals:   03/19/22 1049  BP: (!) 142/78  Pulse: 72  Resp: 16  Temp: 98.5 F (36.9 C)  SpO2: 98%   Filed Weights   03/19/22 1049  Weight: 137 lb 9.6 oz (62.4 kg)    Physical Exam Vitals and nursing note reviewed.  HENT:     Head: Normocephalic and atraumatic.     Mouth/Throat:     Pharynx: Oropharynx is clear.  Eyes:     Extraocular Movements: Extraocular movements intact.     Pupils: Pupils are equal, round, and reactive to light.  Cardiovascular:     Rate and Rhythm: Normal rate and regular rhythm.  Pulmonary:     Comments: Decreased breath sounds bilaterally.  Abdominal:     Palpations: Abdomen is soft.  Musculoskeletal:        General: Normal range of motion.     Cervical back: Normal range of motion.  Skin:    General: Skin is warm.  Neurological:     General: No focal deficit present.     Mental Status: She is alert and oriented to person, place, and time.  Psychiatric:        Behavior: Behavior normal.        Judgment: Judgment normal.     LABORATORY DATA:  I have reviewed the data as listed Lab Results  Component Value Date   WBC 6.7 03/19/2022   HGB 11.4 (L) 03/19/2022   HCT 35.3 (L) 03/19/2022   MCV 94.9 03/19/2022   PLT 487 (H) 03/19/2022   Recent Labs    09/22/21 1515 12/18/21 0951 03/19/22 0952  NA 138 137 139  K 3.4* 3.5 3.8  CL 99 101 105  CO2 '29 28 30  '$ GLUCOSE 167* 106* 102*  BUN '20 22 23  '$ CREATININE 0.90 0.84 0.93  CALCIUM 9.2 9.2 9.0  GFRNONAA >60 >60 >60  PROT 7.1 7.2 7.1  ALBUMIN 4.0 3.8 3.7  AST '25 21 19  '$ ALT '20 19 17  '$ ALKPHOS 59 52 47  BILITOT 0.4 0.3 0.6     MM 3D SCREEN BREAST BILATERAL  Result Date: 02/26/2022 CLINICAL  DATA:  Screening. EXAM: DIGITAL SCREENING BILATERAL MAMMOGRAM WITH TOMOSYNTHESIS AND CAD TECHNIQUE: Bilateral screening digital craniocaudal and mediolateral oblique mammograms were obtained. Bilateral screening digital breast tomosynthesis was performed. The images were evaluated with computer-aided detection. COMPARISON:  Previous exam(s). ACR Breast Density Category b: There are scattered areas of fibroglandular density. FINDINGS: There are no findings suspicious for malignancy. IMPRESSION: No mammographic evidence of malignancy. A result letter of this screening mammogram will be mailed directly to the patient. RECOMMENDATION: Screening mammogram in one year. (Code:SM-B-01Y) BI-RADS CATEGORY  1: Negative. Electronically Signed  By: Valentino Saxon M.D.   On: 02/26/2022 13:44    ASSESSMENT & PLAN:   Essential thrombocythemia ([place]) # Essential thrombocytosis- JAK-2 POSITIVE. [place]- mild splenomegaly.  Currently on hydrea 500 mg/day [started Jan 2023].   # Today platelets are 484.  Continue Hydrea at current dose.  # Mild Anemia: Hb 11.6- monitor for now; check iron studies. Recommend GI evaluation re: endoscopies/screening.   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 Try to take PRN Tyelenol  # Diarrhea- G-1 sec to Hydrea- monitor for now.   # DISPOSITION:  # ADD iron studies/ferritin today # referral to [facility] re: anemia # follow up in 3 months MD;  labs-cbc/cmp-Dr.B       Cammie Sickle, MD 03/19/2022 10:07 PM

## 2022-03-19 NOTE — Assessment & Plan Note (Addendum)
#   Essential thrombocytosis- JAK-2 POSITIVE. [place]- mild splenomegaly.  Currently on hydrea 500 mg/day [started Jan 2023].   # Today platelets are 484.  Continue Hydrea at current dose.  # Mild Anemia: Hb 11.6- monitor for now; check iron studies. Recommend GI evaluation re: endoscopies/screening.  Patient did not have any screening colonoscopy.  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 Try to take PRN Tyelenol  # Diarrhea- G-1 sec to Hydrea- monitor for now.   # DISPOSITION:  # ADD iron studies/ferritin today # referral to [facility] re: anemia # follow up in 3 months MD;  labs-cbc/cmp-Dr.B

## 2022-06-19 ENCOUNTER — Inpatient Hospital Stay: Payer: Medicare Other

## 2022-06-19 ENCOUNTER — Encounter: Payer: Self-pay | Admitting: Internal Medicine

## 2022-06-19 ENCOUNTER — Inpatient Hospital Stay: Payer: Medicare Other | Attending: Internal Medicine | Admitting: Internal Medicine

## 2022-06-19 DIAGNOSIS — R161 Splenomegaly, not elsewhere classified: Secondary | ICD-10-CM | POA: Insufficient documentation

## 2022-06-19 DIAGNOSIS — Z791 Long term (current) use of non-steroidal anti-inflammatories (NSAID): Secondary | ICD-10-CM | POA: Diagnosis not present

## 2022-06-19 DIAGNOSIS — E785 Hyperlipidemia, unspecified: Secondary | ICD-10-CM | POA: Insufficient documentation

## 2022-06-19 DIAGNOSIS — Z7982 Long term (current) use of aspirin: Secondary | ICD-10-CM | POA: Insufficient documentation

## 2022-06-19 DIAGNOSIS — Z7984 Long term (current) use of oral hypoglycemic drugs: Secondary | ICD-10-CM | POA: Insufficient documentation

## 2022-06-19 DIAGNOSIS — I10 Essential (primary) hypertension: Secondary | ICD-10-CM | POA: Diagnosis not present

## 2022-06-19 DIAGNOSIS — E119 Type 2 diabetes mellitus without complications: Secondary | ICD-10-CM | POA: Diagnosis not present

## 2022-06-19 DIAGNOSIS — R197 Diarrhea, unspecified: Secondary | ICD-10-CM | POA: Insufficient documentation

## 2022-06-19 DIAGNOSIS — Z79899 Other long term (current) drug therapy: Secondary | ICD-10-CM | POA: Diagnosis not present

## 2022-06-19 DIAGNOSIS — Z86718 Personal history of other venous thrombosis and embolism: Secondary | ICD-10-CM | POA: Insufficient documentation

## 2022-06-19 DIAGNOSIS — D473 Essential (hemorrhagic) thrombocythemia: Secondary | ICD-10-CM | POA: Diagnosis present

## 2022-06-19 LAB — CBC WITH DIFFERENTIAL/PLATELET
Abs Immature Granulocytes: 0.03 10*3/uL (ref 0.00–0.07)
Basophils Absolute: 0.1 10*3/uL (ref 0.0–0.1)
Basophils Relative: 1 %
Eosinophils Absolute: 0.2 10*3/uL (ref 0.0–0.5)
Eosinophils Relative: 3 %
HCT: 36.2 % (ref 36.0–46.0)
Hemoglobin: 12 g/dL (ref 12.0–15.0)
Immature Granulocytes: 0 %
Lymphocytes Relative: 25 %
Lymphs Abs: 1.9 10*3/uL (ref 0.7–4.0)
MCH: 30.8 pg (ref 26.0–34.0)
MCHC: 33.1 g/dL (ref 30.0–36.0)
MCV: 93.1 fL (ref 80.0–100.0)
Monocytes Absolute: 0.5 10*3/uL (ref 0.1–1.0)
Monocytes Relative: 7 %
Neutro Abs: 4.9 10*3/uL (ref 1.7–7.7)
Neutrophils Relative %: 64 %
Platelets: 568 10*3/uL — ABNORMAL HIGH (ref 150–400)
RBC: 3.89 MIL/uL (ref 3.87–5.11)
RDW: 13.6 % (ref 11.5–15.5)
WBC: 7.6 10*3/uL (ref 4.0–10.5)
nRBC: 0 % (ref 0.0–0.2)

## 2022-06-19 LAB — COMPREHENSIVE METABOLIC PANEL
ALT: 19 U/L (ref 0–44)
AST: 19 U/L (ref 15–41)
Albumin: 3.9 g/dL (ref 3.5–5.0)
Alkaline Phosphatase: 55 U/L (ref 38–126)
Anion gap: 9 (ref 5–15)
BUN: 26 mg/dL — ABNORMAL HIGH (ref 8–23)
CO2: 28 mmol/L (ref 22–32)
Calcium: 9.1 mg/dL (ref 8.9–10.3)
Chloride: 102 mmol/L (ref 98–111)
Creatinine, Ser: 0.93 mg/dL (ref 0.44–1.00)
GFR, Estimated: >60 mL/min (ref >60)
Glucose, Bld: 88 mg/dL (ref 70–99)
Potassium: 3.8 mmol/L (ref 3.5–5.1)
Sodium: 139 mmol/L (ref 135–145)
Total Bilirubin: 0.6 mg/dL (ref 0.3–1.2)
Total Protein: 7.2 g/dL (ref 6.5–8.1)

## 2022-06-19 LAB — LACTATE DEHYDROGENASE: LDH: 124 U/L (ref 98–192)

## 2022-06-19 NOTE — Progress Notes (Signed)
[place] NOTE  Patient Care Team: Ane Payment, [place] (Inactive) as PCP - General (Physician Assistant) Cammie Sickle, MD as Consulting Physician (Oncology)  CHIEF COMPLAINTS/PURPOSE OF CONSULTATION: Essential thrombocytosis.   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   # Hx of Left LE DVT [in 8101]-    Essential thrombocythemia ([place])  09/22/2021 Initial Diagnosis   Essential thrombocythemia ([place])     HISTORY OF PRESENTING ILLNESS: Ambulating independently.  Accompanied with granddaughter; Interpreter.  Virginia Esparza 66 y.o.  female pleasant patient with history of JAK-2 positive ET on hydrea is here for follow-up.  Patient denies any nausea vomiting.  Any fevers or chills.  Denies any weight loss.  Mild loose stools.  As per the patient family unable to make appointment with GI-issues with scheduling.  Review of Systems  Constitutional:  Negative for chills, diaphoresis, fever, malaise/fatigue and weight loss.  HENT:  Negative for nosebleeds and sore throat.   Eyes:  Negative for double vision.  Respiratory:  Negative for cough, hemoptysis, sputum production, shortness of breath and wheezing.   Cardiovascular:  Negative for chest pain, palpitations, orthopnea and leg swelling.  Gastrointestinal:  Negative for abdominal pain, blood in stool, constipation, diarrhea, heartburn, melena, nausea and vomiting.  Genitourinary:  Negative for dysuria, frequency and urgency.  Musculoskeletal:  Positive for joint pain. Negative for back pain.  Skin: Negative.  Negative for itching and rash.  Neurological:  Negative for dizziness, tingling, focal weakness, weakness and headaches.  Endo/Heme/Allergies:  Does not  bruise/bleed easily.  Psychiatric/Behavioral:  Negative for depression. The patient is not nervous/anxious and does not have insomnia.      MEDICAL HISTORY:  Past Medical History:  Diagnosis Date   Diabetes mellitus without complication (HCC)    prediabetic   Hyperlipemia    Hypertension     SURGICAL HISTORY: Past Surgical History:  Procedure Laterality Date   CESAREAN SECTION  1981   CESAREAN SECTION  1995    SOCIAL HISTORY: Social History   Socioeconomic History   Marital status: Married    Spouse name: Not on file   Number of children: Not on file   Years of education: Not on file   Highest education level: Not on file  Occupational History   Not on file  Tobacco Use   Smoking status: Never   Smokeless tobacco: Never  Vaping Use   Vaping Use: Never used  Substance and Sexual Activity   Alcohol use: Never   Drug use: Never   Sexual activity: Yes  Other Topics Concern   Not on file  Social History 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 Lives in [place]- with husband/son.    Social Determinants of Health   Financial Resource Strain: Not on file  Food Insecurity: Not on file  Transportation Needs: Not on file  Physical Activity: Not on file  Stress: Not on file  Social Connections: Not on file  Intimate Partner Violence: Not on file    FAMILY HISTORY: Family History  Problem Relation Age of Onset   Diabetes Mother     ALLERGIES:  has no allergies on file.  MEDICATIONS:  Current Outpatient Medications  Medication Sig Dispense Refill   aspirin 81 MG EC tablet Take 81 mg by mouth daily.  atorvastatin (LIPITOR) 40 MG tablet Take 40 mg by mouth daily as needed.     chlorthalidone (HYGROTON) 25 MG tablet Take 25 mg by mouth daily.     hydroxyurea (HYDREA) 500 MG capsule Take 1 capsule (500 mg total) by mouth daily. May take with food to minimize GI side effects. 30 capsule 3   lisinopril (ZESTRIL) 40 MG tablet Take 40 mg by  mouth daily.     meloxicam (MOBIC) 15 MG tablet Take 15 mg by mouth daily. Take 1 tablet by mouth for shoulder pain.     metFORMIN (GLUCOPHAGE) 500 MG tablet Take 500 mg by mouth 2 (two) times daily.     No current facility-administered medications for this visit.     PHYSICAL EXAMINATION:   Vitals:   06/19/22 1318  BP: 110/77  Pulse: 97  Resp: 16  Temp: (!) 97.2 F (36.2 C)   Filed Weights   06/19/22 1318  Weight: 134 lb 3.2 oz (60.9 kg)    Physical Exam Vitals and nursing note reviewed.  HENT:     Head: Normocephalic and atraumatic.     Mouth/Throat:     Pharynx: Oropharynx is clear.  Eyes:     Extraocular Movements: Extraocular movements intact.     Pupils: Pupils are equal, round, and reactive to light.  Cardiovascular:     Rate and Rhythm: Normal rate and regular rhythm.  Pulmonary:     Comments: Decreased breath sounds bilaterally.  Abdominal:     Palpations: Abdomen is soft.  Musculoskeletal:        General: Normal range of motion.     Cervical back: Normal range of motion.  Skin:    General: Skin is warm.  Neurological:     General: No focal deficit present.     Mental Status: She is alert and oriented to person, place, and time.  Psychiatric:        Behavior: Behavior normal.        Judgment: Judgment normal.      LABORATORY DATA:  I have reviewed the data as listed Lab Results  Component Value Date   WBC 7.6 06/19/2022   HGB 12.0 06/19/2022   HCT 36.2 06/19/2022   MCV 93.1 06/19/2022   PLT 568 (H) 06/19/2022   Recent Labs    12/18/21 0951 03/19/22 0952 06/19/22 1316  NA 137 139 139  K 3.5 3.8 3.8  CL 101 105 102  CO2 '28 30 28  '$ GLUCOSE 106* 102* 88  BUN 22 23 26*  CREATININE 0.84 0.93 0.93  CALCIUM 9.2 9.0 9.1  GFRNONAA >60 >60 >60  PROT 7.2 7.1 7.2  ALBUMIN 3.8 3.7 3.9  AST '21 19 19  '$ ALT '19 17 19  '$ ALKPHOS 52 47 55  BILITOT 0.3 0.6 0.6     No results found.  ASSESSMENT & PLAN:   Essential thrombocythemia ([place]) #  Essential thrombocytosis- JAK-2 POSITIVE. [place]- mild splenomegaly.  Currently on hydrea 500 mg/day [started Jan 2023].   # Today platelets are 538.  Continue Hydrea at current dose. If not improved will increase to 2 pill a day at next visit.   # Mild Anemia: Hb 12- monitor for now; Recommend GI evaluation re: endoscopies/screening.  Patient did not have any screening colonoscopy.  We will send a referral again.  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 Try to take PRN Tyelenol  # Diarrhea- G-1 sec to Hydrea- monitor for  now.   # DISPOSITION:  # referral to [facility] re: anemia/screening colonoscopy # follow up in 4 months MD;  labs-cbc/cmp/LDH--Dr.B       Cammie Sickle, MD 06/19/2022 2:08 PM

## 2022-06-19 NOTE — Assessment & Plan Note (Addendum)
#   Essential thrombocytosis- JAK-2 POSITIVE. [place]- mild splenomegaly.  Currently on hydrea 500 mg/day [started Jan 2023].   # Today platelets are 538.  Continue Hydrea at current dose. If not improved will increase to 2 pill a day at next visit.   # Mild Anemia: Hb 12- monitor for now; Recommend GI evaluation re: endoscopies/screening.  Patient did not have any screening colonoscopy.  We will send a referral again.  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 Try to take PRN Tyelenol  # Diarrhea- G-1 sec to Hydrea- monitor for now.   # DISPOSITION:  # referral to [facility] re: anemia/screening colonoscopy # follow up in 4 months MD;  labs-cbc/cmp/LDH--Dr.B

## 2022-06-19 NOTE — Progress Notes (Signed)
Patient denies new problems/concerns today.    Accompanied by her granddaughter today who states she will be interpreter for visit.

## 2022-06-20 ENCOUNTER — Encounter: Payer: Self-pay | Admitting: Gastroenterology

## 2022-06-20 ENCOUNTER — Ambulatory Visit (INDEPENDENT_AMBULATORY_CARE_PROVIDER_SITE_OTHER): Payer: Medicare Other | Admitting: Gastroenterology

## 2022-06-20 ENCOUNTER — Other Ambulatory Visit: Payer: Self-pay

## 2022-06-20 VITALS — BP 122/74 | HR 71 | Temp 97.9°F | Ht <= 58 in | Wt 133.0 lb

## 2022-06-20 DIAGNOSIS — K529 Noninfective gastroenteritis and colitis, unspecified: Secondary | ICD-10-CM | POA: Diagnosis not present

## 2022-06-20 DIAGNOSIS — D509 Iron deficiency anemia, unspecified: Secondary | ICD-10-CM | POA: Diagnosis not present

## 2022-06-20 MED ORDER — NA SULFATE-K SULFATE-MG SULF 17.5-3.13-1.6 GM/177ML PO SOLN: 354.0000 mL | Freq: Once | ORAL | 0 refills | Status: AC

## 2022-06-20 NOTE — Progress Notes (Signed)
Cephas Darby, MD [address]  [place]  [place], [place] [address]  Main: [phone]  Fax: [phone]    Gastroenterology Consultation  Referring Provider:     Cammie Sickle, * Primary Care Physician:  Ane Payment, PA (Inactive) Primary Gastroenterologist:  Dr. Cephas Darby Reason for Consultation: Chronic diarrhea and iron deficiency anemia        HPI:   Virginia Esparza is a 66 y.o. female referred by Dr. Norlene Duel, Felicity Coyer, [place] (Inactive)  for consultation & management of chronic iron deficiency anemia.  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  Patient also has some mild chronic iron deficiency anemia.  Her serum ferritin levels are 15 and 03/2022.  Patient reports having significant postprandial diarrhea for last several months, 5-6 bowel movements daily, without any rectal bleeding, denies any nocturnal diarrhea, denies nausea, vomiting, abdominal pain.  Patient has been gradually losing weight within last 6 months although she denies any loss of appetite.  She never had an endoscopic evaluation for her GI symptoms.  Her diarrhea is attributed to hydroxyurea.  NSAIDs: None  Antiplts/Anticoagulants/Anti thrombotics: None  GI Procedures: None  Past Medical History:  Diagnosis Date   Diabetes mellitus without complication ([place])    prediabetic   Hyperlipemia    Hypertension     Past Surgical History:  Procedure Laterality Date   CESAREAN SECTION  1981   CESAREAN SECTION  1995     Current Outpatient Medications:    aspirin 81 MG EC tablet, Take 81 mg by mouth daily., Disp: , Rfl:    atorvastatin (LIPITOR) 40 MG tablet, Take 40 mg by mouth daily as needed., Disp: , Rfl:    chlorthalidone (HYGROTON) 25 MG tablet, Take 25 mg by mouth daily., Disp: , Rfl:    hydroxyurea (HYDREA) 500 MG capsule, Take 1  capsule (500 mg total) by mouth daily. May take with food to minimize GI side effects., Disp: 30 capsule, Rfl: 3   lisinopril (ZESTRIL) 40 MG tablet, Take 40 mg by mouth daily., Disp: , Rfl:    meloxicam (MOBIC) 15 MG tablet, Take 15 mg by mouth daily. Take 1 tablet by mouth for shoulder pain., Disp: , Rfl:    metFORMIN (GLUCOPHAGE) 500 MG tablet, Take 500 mg by mouth 2 (two) times daily., Disp: , Rfl:    Na Sulfate-K Sulfate-Mg Sulf 17.5-3.13-1.6 GM/177ML SOLN, Take 354 mLs by mouth once for 1 dose., Disp: 354 mL, Rfl: 0   Family History  Problem Relation Age of Onset   Diabetes Mother      Social History   Tobacco Use   Smoking status: Never   Smokeless tobacco: Never  Vaping Use   Vaping Use: Never used  Substance Use Topics   Alcohol use: Never   Drug use: Never    Allergies as of 06/20/2022   (No Known Allergies)    Review of Systems:    All systems reviewed and negative except where noted in HPI.   Physical Exam:  BP 122/74 (BP Location: Left Arm, Patient Position: Sitting, Cuff Size: Normal)   Pulse 71   Temp 97.9 F (36.6 C) (Oral)   Ht '4\' 10"'$  (1.473 m)   Wt 133 lb (60.3 kg)   BMI 27.80 kg/m  No LMP recorded. Patient is postmenopausal.  General:   Alert,  Well-developed, well-nourished, pleasant and cooperative in NAD Head:  Normocephalic and atraumatic. Eyes:  Sclera clear, no icterus.   Conjunctiva pink. Ears:  Normal auditory acuity. Nose:  No deformity, discharge, or lesions. Mouth:  No deformity or lesions,oropharynx pink & moist. Neck:  Supple; no masses or thyromegaly. Lungs:  Respirations even and unlabored.  Clear throughout to auscultation.   No wheezes, crackles, or rhonchi. No acute distress. Heart:  Regular rate and rhythm; no murmurs, clicks, rubs, or gallops. Abdomen:  Normal bowel sounds. Soft, non-tender and non-distended without masses, hepatosplenomegaly or hernias noted.  No guarding or rebound tenderness.   Rectal: Not performed Msk:   Symmetrical without gross deformities. Good, equal movement & strength bilaterally. Pulses:  Normal pulses noted. Extremities:  No clubbing or edema.  No cyanosis. Neurologic:  Alert and oriented x3;  grossly normal neurologically. Skin:  Intact without significant lesions or rashes. No jaundice. Psych:  Alert and cooperative. Normal mood and affect.  Imaging Studies: Reviewed  Assessment and Plan:   Virginia Esparza is a 66 y.o. pleasant Spanish-speaking female with history of diabetes, hypertension, hyperlipidemia JAK2 positive essential thrombocythemia maintained on hydroxyurea is seen in consultation for mild chronic iron deficiency anemia, chronic nonbloody diarrhea and unintentional weight loss  Recommend upper endoscopy with gastric and duodenal biopsies Colonoscopy with possible TI evaluation, random colon biopsies  I have discussed alternative options, risks & benefits,  which include, but are not limited to, bleeding, infection, perforation,respiratory complication & drug reaction.  The patient agrees with this plan & written consent will be obtained.    Follow up based on the above work-up   Cephas Darby, MD

## 2022-07-09 ENCOUNTER — Encounter: Payer: Self-pay | Admitting: Gastroenterology

## 2022-07-12 ENCOUNTER — Ambulatory Visit (AMBULATORY_SURGERY_CENTER): Payer: Medicare Other | Admitting: Anesthesiology

## 2022-07-12 ENCOUNTER — Ambulatory Visit: Payer: Medicare Other | Admitting: Anesthesiology

## 2022-07-12 ENCOUNTER — Encounter
Admission: RE | Disposition: A | Discharge status: Home / Self Care | Payer: Self-pay | Source: Home / Self Care | Attending: Gastroenterology

## 2022-07-12 ENCOUNTER — Other Ambulatory Visit: Payer: Self-pay

## 2022-07-12 ENCOUNTER — Ambulatory Visit
Admission: RE | Admit: 2022-07-12 | Discharge: 2022-07-12 | Disposition: A | Discharge status: Home / Self Care | Payer: Medicare Other | Attending: Gastroenterology | Admitting: Gastroenterology

## 2022-07-12 ENCOUNTER — Encounter: Payer: Self-pay | Admitting: Gastroenterology

## 2022-07-12 DIAGNOSIS — K529 Noninfective gastroenteritis and colitis, unspecified: Secondary | ICD-10-CM

## 2022-07-12 DIAGNOSIS — R197 Diarrhea, unspecified: Secondary | ICD-10-CM | POA: Insufficient documentation

## 2022-07-12 DIAGNOSIS — E1151 Type 2 diabetes mellitus with diabetic peripheral angiopathy without gangrene: Secondary | ICD-10-CM | POA: Insufficient documentation

## 2022-07-12 DIAGNOSIS — K3189 Other diseases of stomach and duodenum: Secondary | ICD-10-CM | POA: Insufficient documentation

## 2022-07-12 DIAGNOSIS — D509 Iron deficiency anemia, unspecified: Secondary | ICD-10-CM | POA: Diagnosis not present

## 2022-07-12 DIAGNOSIS — K2100 Gastro-esophageal reflux disease with esophagitis, without bleeding: Secondary | ICD-10-CM | POA: Diagnosis not present

## 2022-07-12 DIAGNOSIS — K259 Gastric ulcer, unspecified as acute or chronic, without hemorrhage or perforation: Secondary | ICD-10-CM

## 2022-07-12 DIAGNOSIS — Z7984 Long term (current) use of oral hypoglycemic drugs: Secondary | ICD-10-CM | POA: Insufficient documentation

## 2022-07-12 DIAGNOSIS — Z79899 Other long term (current) drug therapy: Secondary | ICD-10-CM | POA: Diagnosis not present

## 2022-07-12 DIAGNOSIS — I10 Essential (primary) hypertension: Secondary | ICD-10-CM | POA: Diagnosis not present

## 2022-07-12 DIAGNOSIS — K319 Disease of stomach and duodenum, unspecified: Secondary | ICD-10-CM | POA: Diagnosis not present

## 2022-07-12 DIAGNOSIS — R634 Abnormal weight loss: Secondary | ICD-10-CM | POA: Insufficient documentation

## 2022-07-12 HISTORY — PX: ESOPHAGOGASTRODUODENOSCOPY (EGD) WITH PROPOFOL: SHX5813

## 2022-07-12 HISTORY — DX: Anemia, unspecified: D64.9

## 2022-07-12 HISTORY — DX: Presence of dental prosthetic device (complete) (partial): Z97.2

## 2022-07-12 HISTORY — PX: BIOPSY: SHX5522

## 2022-07-12 HISTORY — DX: Thrombocytopenia, unspecified: D69.6

## 2022-07-12 HISTORY — PX: COLONOSCOPY WITH PROPOFOL: SHX5780

## 2022-07-12 LAB — GLUCOSE, CAPILLARY: Glucose-Capillary: 126 mg/dL — ABNORMAL HIGH (ref 70–99)

## 2022-07-12 SURGERY — COLONOSCOPY WITH PROPOFOL
Anesthesia: General | Site: Rectum

## 2022-07-12 MED ORDER — OMEPRAZOLE 40 MG PO CPDR: 40.0000 mg | DELAYED_RELEASE_CAPSULE | Freq: Every day | ORAL | 2 refills | Status: DC

## 2022-07-12 MED ORDER — PROPOFOL 10 MG/ML IV BOLUS
INTRAVENOUS | Status: DC | PRN
  Administered 2022-07-12 (×2): 50 mg via INTRAVENOUS
  Administered 2022-07-12: 140 ug/kg/min via INTRAVENOUS
  Administered 2022-07-12: 50 mg via INTRAVENOUS

## 2022-07-12 MED ORDER — STERILE WATER FOR IRRIGATION IR SOLN
Status: DC | PRN
  Administered 2022-07-12: 60 mL

## 2022-07-12 MED ORDER — GLYCOPYRROLATE 0.2 MG/ML IJ SOLN
INTRAMUSCULAR | Status: DC | PRN
  Administered 2022-07-12: .1 mg via INTRAVENOUS

## 2022-07-12 MED ORDER — LACTATED RINGERS IV SOLN: INTRAVENOUS | Status: DC

## 2022-07-12 MED ORDER — PHENYLEPHRINE HCL (PRESSORS) 10 MG/ML IV SOLN
INTRAVENOUS | Status: DC | PRN
  Administered 2022-07-12 (×3): 100 ug via INTRAVENOUS

## 2022-07-12 MED ORDER — STERILE WATER FOR IRRIGATION IR SOLN
Status: DC | PRN
  Administered 2022-07-12: 150 mL

## 2022-07-12 MED ORDER — SODIUM CHLORIDE 0.9 % IV SOLN: INTRAVENOUS | Status: DC

## 2022-07-12 MED ORDER — LIDOCAINE HCL (CARDIAC) PF 100 MG/5ML IV SOSY
PREFILLED_SYRINGE | INTRAVENOUS | Status: DC | PRN
  Administered 2022-07-12: 60 mg via INTRAVENOUS

## 2022-07-12 SURGICAL SUPPLY — 8 items
BLOCK BITE 60FR ADLT L/F GRN (MISCELLANEOUS) ×2 IMPLANT
FORCEPS BIOP RAD 4 LRG CAP 4 (CUTTING FORCEPS) IMPLANT
GOWN CVR UNV OPN BCK APRN NK (MISCELLANEOUS) ×4 IMPLANT
GOWN ISOL THUMB LOOP REG UNIV (MISCELLANEOUS) ×4
KIT PRC NS LF DISP ENDO (KITS) ×2 IMPLANT
KIT PROCEDURE OLYMPUS (KITS) ×2
MANIFOLD NEPTUNE II (INSTRUMENTS) ×2 IMPLANT
WATER STERILE IRR 250ML POUR (IV SOLUTION) ×2 IMPLANT

## 2022-07-12 NOTE — Op Note (Signed)
[facility] Gastroenterology Patient Name: Virginia Esparza Procedure Date: 07/12/2022 1:47 PM MRN: [id number] Account #: [id number] Date of Birth: [birth year] Admit Type: Outpatient Age: 66 Room: [facility] OR ROOM 01 Gender: Female Note Status: Finalized Instrument Name: [id number] Procedure:             Upper GI endoscopy Indications:           Epigastric abdominal pain, Unexplained iron deficiency                         anemia, Diarrhea, Weight loss Providers:             Lin Landsman MD, MD Medicines:             General Anesthesia Complications:         No immediate complications. Estimated blood loss: None. Procedure:             Pre-Anesthesia Assessment:                        - Prior to the procedure, a History and Physical was                         performed, and patient medications and allergies were                         reviewed. The patient is competent. The risks and                         benefits of the procedure and the sedation options and                         risks were discussed with the patient. All questions                         were answered and informed consent was obtained.                         Patient identification and proposed procedure were                         verified by the physician, the nurse, the                         anesthesiologist, the anesthetist and the technician                         in the pre-procedure area in the procedure room in the                         endoscopy suite. Mental Status Examination: alert and                         oriented. Airway Examination: normal oropharyngeal                         airway and neck mobility. Respiratory Examination:                         clear to auscultation.  CV Examination: normal.                         Prophylactic Antibiotics: The patient does not require                         prophylactic antibiotics. Prior Anticoagulants: The                          patient has taken no previous anticoagulant or                         antiplatelet agents. ASA Grade Assessment: II - A                         patient with mild systemic disease. After reviewing                         the risks and benefits, the patient was deemed in                         satisfactory condition to undergo the procedure. The                         anesthesia plan was to use general anesthesia.                         Immediately prior to administration of medications,                         the patient was re-assessed for adequacy to receive                         sedatives. The heart rate, respiratory rate, oxygen                         saturations, blood pressure, adequacy of pulmonary                         ventilation, and response to care were monitored                         throughout the procedure. The physical status of the                         patient was re-assessed after the procedure.                        After obtaining informed consent, the endoscope was                         passed under direct vision. Throughout the procedure,                         the patient's blood pressure, pulse, and oxygen                         saturations were monitored continuously. The Endoscope  was introduced through the mouth, and advanced to the                         second part of duodenum. The upper GI endoscopy was                         accomplished without difficulty. The patient tolerated                         the procedure well. Findings:      The duodenal bulb and second portion of the duodenum were normal.       Biopsies were taken with a cold forceps for histology.      A few dispersed diminutive erosions with no bleeding and no stigmata of       recent bleeding were found in the prepyloric region of the stomach.       Biopsies were taken with a cold forceps for Helicobacter pylori testing.      Striped mildly  erythematous mucosa without bleeding was found in the       gastric body and in the gastric antrum. Biopsies were taken with a cold       forceps for histology.      The cardia and gastric fundus were normal on retroflexion.      Esophagogastric landmarks were identified: the gastroesophageal junction       was found at 35 cm from the incisors.      LA Grade A (one or more mucosal breaks less than 5 mm, not extending       between tops of 2 mucosal folds) esophagitis with no bleeding was found       at the gastroesophageal junction.      The examined esophagus was normal. Impression:            - Normal duodenal bulb and second portion of the                         duodenum. Biopsied.                        - Erosive gastropathy with no bleeding and no stigmata                         of recent bleeding. Biopsied.                        - Erythematous mucosa in the gastric body and antrum.                         Biopsied.                        - Esophagogastric landmarks identified.                        - LA Grade A reflux esophagitis with no bleeding.                        - Normal esophagus. Recommendation:        - Await pathology results.                        -  Use a proton pump inhibitor PO BID for 1 month.                        - Proceed with colonoscopy as scheduled                        See colonoscopy report Procedure Code(s):     --- Professional ---                        [phone], Esophagogastroduodenoscopy, flexible,                         transoral; with biopsy, single or multiple Diagnosis Code(s):     --- Professional ---                        K31.89, Other diseases of stomach and duodenum                        K21.00, Gastro-esophageal reflux disease with                         esophagitis, without bleeding                        D50.9, Iron deficiency anemia, unspecified                        R10.13, Epigastric pain                        R19.7, Diarrhea,  unspecified                        R63.4, Abnormal weight loss CPT copyright 2019 American Medical Association. All rights reserved. The codes documented in this report are preliminary and upon coder review may  be revised to meet current compliance requirements. Dr. Ulyess Mort Lin Landsman MD, MD 07/12/2022 2:12:56 PM This report has been signed electronically. Number of Addenda: 0 Note Initiated On: 07/12/2022 1:47 PM Total Procedure Duration: 0 hours 6 minutes 51 seconds  Estimated Blood Loss:  Estimated blood loss: none.      [facility]

## 2022-07-12 NOTE — H&P (Signed)
Cephas Darby, MD [address]  [place]  [place], [place] [address]  Main: [phone]  Fax: [phone] Pager: [phone]  Primary Care Physician:  Ane Payment, PA (Inactive) Primary Gastroenterologist:  Dr. Cephas Darby  Pre-Procedure History & Physical: HPI:  Virginia Esparza is a 66 y.o. female is here for an endoscopy and colonoscopy.   Past Medical History:  Diagnosis Date   Anemia    Diabetes mellitus without complication ([place])    prediabetic   Hyperlipemia    Hypertension    Thrombocytopenia ([place])    Wears dentures    full upper    Past Surgical History:  Procedure Laterality Date   [place]   TOE SURGERY      Prior to Admission medications   Medication Sig Start Date End Date Taking? Authorizing Provider  aspirin 81 MG EC tablet Take 81 mg by mouth daily.   Yes [provider]  atorvastatin (LIPITOR) 40 MG tablet Take 40 mg by mouth daily as needed. 09/12/21  Yes [provider]  chlorthalidone (HYGROTON) 25 MG tablet Take 25 mg by mouth daily. 08/24/21  Yes [provider]  hydroxyurea (HYDREA) 500 MG capsule Take 1 capsule (500 mg total) by mouth daily. May take with food to minimize GI side effects. 03/19/22  Yes Cammie Sickle, MD  lisinopril (ZESTRIL) 40 MG tablet Take 40 mg by mouth daily. 08/24/21  Yes [provider]  metFORMIN (GLUCOPHAGE) 500 MG tablet Take 500 mg by mouth 2 (two) times daily. 08/07/21  Yes [provider]  Multiple Vitamin (MULTIVITAMIN) tablet Take 1 tablet by mouth daily. [employer]   Yes [provider]  meloxicam (MOBIC) 15 MG tablet Take 15 mg by mouth daily. Take 1 tablet by mouth for shoulder pain. Patient not taking: Reported on 07/09/2022 02/21/22   [provider]    Allergies as of 06/20/2022   (No Known Allergies)    Family History  Problem Relation Age of Onset   Diabetes Mother      Social History   Socioeconomic History   Marital status: Married    Spouse name: Not on file   Number of children: Not on file   Years of education: Not on file   Highest education level: Not on file  Occupational History   Not on file  Tobacco Use   Smoking status: Never   Smokeless tobacco: Never  Vaping Use   Vaping Use: Never used  Substance and Sexual Activity   Alcohol use: Never   Drug use: Never   Sexual activity: Yes  Other Topics Concern   Not on file  Social History 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 Lives in [place]- with husband/son.    Social Determinants of Health   Financial Resource Strain: Not on file  Food Insecurity: Not on file  Transportation Needs: Not on file  Physical Activity: Not on file  Stress: Not on file  Social Connections: Not on file  Intimate Partner Violence: Not on file    Review of Systems: See HPI, otherwise negative ROS  Physical Exam: BP 122/68   Pulse 88   Temp 97.6 F (36.4 C)   Ht '4\' 10"'$  (1.473 m)   Wt 57.6 kg   SpO2 97%   BMI 26.54 kg/m  General:   Alert,  pleasant and cooperative in NAD Head:  Normocephalic and atraumatic. Neck:  Supple; no masses  or thyromegaly. Lungs:  Clear throughout to auscultation.    Heart:  Regular rate and rhythm. Abdomen:  Soft, nontender and nondistended. Normal bowel sounds, without guarding, and without rebound.   Neurologic:  Alert and  oriented x4;  grossly normal neurologically.  Impression/Plan: Virginia Esparza is here for an endoscopy and colonoscopy to be performed for mild chronic iron deficiency anemia, chronic nonbloody diarrhea and unintentional weight loss  Risks, benefits, limitations, and alternatives regarding  endoscopy and colonoscopy have been reviewed with the patient.  Questions have been answered.  All parties agreeable.   Sherri Sear, MD  07/12/2022, 1:07 PM

## 2022-07-12 NOTE — Op Note (Signed)
[facility] Gastroenterology Patient Name: Virginia Esparza Procedure Date: 07/12/2022 1:45 PM MRN: [id number] Account #: [id number] Date of Birth: 1955-11-21 Admit Type: Outpatient Age: 66 Room: [facility] OR ROOM 01 Gender: Female Note Status: Finalized Instrument Name: [id number] Procedure:             Colonoscopy Indications:           Unexplained iron deficiency anemia, Clinically                         significant diarrhea of unexplained origin Providers:             Lin Landsman MD, MD Medicines:             General Anesthesia Complications:         No immediate complications. Estimated blood loss: None. Procedure:             Pre-Anesthesia Assessment:                        - Prior to the procedure, a History and Physical was                         performed, and patient medications and allergies were                         reviewed. The patient is competent. The risks and                         benefits of the procedure and the sedation options and                         risks were discussed with the patient. All questions                         were answered and informed consent was obtained.                         Patient identification and proposed procedure were                         verified by the physician, the nurse, the                         anesthesiologist, the anesthetist and the technician                         in the pre-procedure area in the procedure room in the                         endoscopy suite. Mental Status Examination: alert and                         oriented. Airway Examination: normal oropharyngeal                         airway and neck mobility. Respiratory Examination:                         clear to auscultation. CV  Examination: normal.                         Prophylactic Antibiotics: The patient does not require                         prophylactic antibiotics. Prior Anticoagulants: The                          patient has taken no previous anticoagulant or                         antiplatelet agents. ASA Grade Assessment: II - A                         patient with mild systemic disease. After reviewing                         the risks and benefits, the patient was deemed in                         satisfactory condition to undergo the procedure. The                         anesthesia plan was to use general anesthesia.                         Immediately prior to administration of medications,                         the patient was re-assessed for adequacy to receive                         sedatives. The heart rate, respiratory rate, oxygen                         saturations, blood pressure, adequacy of pulmonary                         ventilation, and response to care were monitored                         throughout the procedure. The physical status of the                         patient was re-assessed after the procedure.                        After obtaining informed consent, the colonoscope was                         passed under direct vision. Throughout the procedure,                         the patient's blood pressure, pulse, and oxygen                         saturations were monitored continuously. The was  introduced through the anus and advanced to the the                         terminal ileum, with identification of the appendiceal                         orifice and IC valve. The colonoscopy was performed                         without difficulty. The patient tolerated the                         procedure well. The quality of the bowel preparation                         was evaluated using the BBPS [facility] Bowel Preparation                         Scale) with scores of: Right Colon = 3, Transverse                         Colon = 3 and Left Colon = 3 (entire mucosa seen well                         with no residual staining, small  fragments of stool or                         opaque liquid). The total BBPS score equals 9. Findings:      The perianal and digital rectal examinations were normal. Pertinent       negatives include normal sphincter tone and no palpable rectal lesions.      The terminal ileum appeared normal.      Normal mucosa was found in the entire colon. Biopsies for histology were       taken with a cold forceps from the entire colon for evaluation of       microscopic colitis.      The retroflexed view of the distal rectum and anal verge was normal and       showed no anal or rectal abnormalities. Impression:            - The examined portion of the ileum was normal.                        - Normal mucosa in the entire examined colon. Biopsied.                        - The distal rectum and anal verge are normal on                         retroflexion view. Recommendation:        - Discharge patient to home (with escort).                        - Resume previous diet today.                        - Continue present medications.                        -  Await pathology results. Procedure Code(s):     --- Professional ---                        [phone], Colonoscopy, flexible; with biopsy, single or                         multiple Diagnosis Code(s):     --- Professional ---                        D50.9, Iron deficiency anemia, unspecified                        R19.7, Diarrhea, unspecified CPT copyright 2019 American Medical Association. All rights reserved. The codes documented in this report are preliminary and upon coder review may  be revised to meet current compliance requirements. Dr. Ulyess Mort Lin Landsman MD, MD 07/12/2022 2:31:08 PM This report has been signed electronically. Number of Addenda: 0 Note Initiated On: 07/12/2022 1:45 PM Scope Withdrawal Time: 0 hours 10 minutes 54 seconds  Total Procedure Duration: 0 hours 13 minutes 31 seconds  Estimated Blood Loss:  Estimated  blood loss: none.      [facility]

## 2022-07-12 NOTE — Transfer of Care (Signed)
Immediate Anesthesia Transfer of Care Note  Patient: Virginia Esparza  Procedure(s) Performed: COLONOSCOPY WITH PROPOFOL ESOPHAGOGASTRODUODENOSCOPY (EGD) WITH PROPOFOL (Mouth) BIOPSY (Mouth)  Patient Location: PACU  Anesthesia Type: General  Level of Consciousness: awake, alert  and patient cooperative  Airway and Oxygen Therapy: Patient Spontanous Breathing and Patient connected to supplemental oxygen  Post-op Assessment: Post-op Vital signs reviewed, Patient's Cardiovascular Status Stable, Respiratory Function Stable, Patent Airway and No signs of Nausea or vomiting  Post-op Vital Signs: Reviewed and stable  Complications: There were no known notable events for this encounter.

## 2022-07-12 NOTE — Anesthesia Preprocedure Evaluation (Signed)
Anesthesia Evaluation  Patient identified by MRN, date of birth, ID band Patient awake    Reviewed: Allergy & Precautions, NPO status , Patient's Chart, lab work & pertinent test results  History of Anesthesia Complications Negative for: history of anesthetic complications  Airway Mallampati: III  TM Distance: >3 FB Neck ROM: Full    Dental  (+) Partial Upper, Chipped   Pulmonary neg pulmonary ROS, neg sleep apnea, neg COPD, Patient abstained from smoking.Not current smoker,    Pulmonary exam normal breath sounds clear to auscultation       Cardiovascular Exercise Tolerance: Good METShypertension, Pt. on medications + Peripheral Vascular Disease  (-) CAD and (-) Past MI (-) dysrhythmias  Rhythm:Regular Rate:Normal - Systolic murmurs Patient says her blood pressure runs low at baseline   Neuro/Psych negative neurological ROS  negative psych ROS   GI/Hepatic neg GERD  ,(+)     (-) substance abuse  ,   Endo/Other  diabetes, Oral Hypoglycemic Agents  Renal/GU negative Renal ROS     Musculoskeletal   Abdominal   Peds  Hematology   Anesthesia Other Findings Past Medical History: No date: Anemia No date: Diabetes mellitus without complication ([facility])     Comment:  prediabetic No date: Hyperlipemia No date: Hypertension No date: Thrombocytopenia ([place]) No date: Wears dentures     Comment:  full upper  Reproductive/Obstetrics                             Anesthesia Physical Anesthesia Plan  ASA: 2  Anesthesia Plan: General   Post-op Pain Management: Minimal or no pain anticipated   Induction: Intravenous  PONV Risk Score and Plan: 3 and Propofol infusion, TIVA and Ondansetron  Airway Management Planned: Nasal Cannula  Additional Equipment: None  Intra-op Plan:   Post-operative Plan:   Informed Consent: I have reviewed the patients History and Physical, chart, labs and discussed  the procedure including the risks, benefits and alternatives for the proposed anesthesia with the patient or authorized representative who has indicated his/her understanding and acceptance.     Dental advisory given  Plan Discussed with: CRNA and Surgeon  Anesthesia Plan Comments: (Discussed risks of anesthesia with patient, including possibility of difficulty with spontaneous ventilation under anesthesia necessitating airway intervention, PONV, and rare risks such as cardiac or respiratory or neurological events, and allergic reactions. Discussed the role of CRNA in patient's perioperative care. Patient understands.)        Anesthesia Quick Evaluation

## 2022-07-12 NOTE — Anesthesia Postprocedure Evaluation (Signed)
Anesthesia Post Note  Patient: Virginia Esparza  Procedure(s) Performed: COLONOSCOPY WITH PROPOFOL (Rectum) ESOPHAGOGASTRODUODENOSCOPY (EGD) WITH PROPOFOL (Mouth) BIOPSY (Mouth)     Patient location during evaluation: PACU Anesthesia Type: General Level of consciousness: awake and alert Pain management: pain level controlled Vital Signs Assessment: post-procedure vital signs reviewed and stable Respiratory status: spontaneous breathing, nonlabored ventilation, respiratory function stable and patient connected to nasal cannula oxygen Cardiovascular status: blood pressure returned to baseline and stable Postop Assessment: no apparent nausea or vomiting Anesthetic complications: no Comments: MAPs low-normal, but similar to preop. Patient asymptomatic.   There were no known notable events for this encounter.  Arita Miss

## 2022-07-13 ENCOUNTER — Encounter: Payer: Self-pay | Admitting: Gastroenterology

## 2022-07-16 LAB — SURGICAL PATHOLOGY

## 2022-07-18 ENCOUNTER — Telehealth: Payer: Self-pay

## 2022-07-18 DIAGNOSIS — K529 Noninfective gastroenteritis and colitis, unspecified: Secondary | ICD-10-CM

## 2022-07-18 NOTE — Telephone Encounter (Signed)
Called Interpreter and the ID [phone] and they called patient and was getting informed the results and the interpreter got disconnected   called interpreter back and this id was [phone] and patient will go for lab work and she verbalized understanding of results

## 2022-07-18 NOTE — Telephone Encounter (Signed)
-----   Message from Lin Landsman, MD sent at 07/17/2022  6:08 PM EDT ----- Please inform patient's daughter that the pathology results from upper endoscopy shows nonspecific inflammation in her small intestine.  Recommend celiac disease panel, check fecal calprotectin levels, GI profile PCR.  The biopsies from colonoscopy came back normal.  I should see her back in 2 to 3 months  RV

## 2022-07-23 ENCOUNTER — Other Ambulatory Visit: Payer: Self-pay

## 2022-07-24 ENCOUNTER — Other Ambulatory Visit: Payer: Self-pay | Admitting: Nurse Practitioner

## 2022-07-24 ENCOUNTER — Telehealth: Payer: Self-pay

## 2022-07-24 DIAGNOSIS — Z780 Asymptomatic menopausal state: Secondary | ICD-10-CM

## 2022-07-24 LAB — CELIAC DISEASE PANEL
Endomysial IgA: NEGATIVE
IgA/Immunoglobulin A, Serum: 383 mg/dL — ABNORMAL HIGH (ref 87–352)
Transglutaminase IgA: <2 U/mL (ref 0–3)

## 2022-07-24 NOTE — Telephone Encounter (Signed)
-----   Message from Lin Landsman, MD sent at 07/24/2022  9:46 AM EDT ----- Celiac panel negative, please remind pt of the stool specimen  Thanks RV

## 2022-07-24 NOTE — Telephone Encounter (Signed)
Patient verbalized understanding of results  

## 2022-07-25 ENCOUNTER — Telehealth: Payer: Self-pay

## 2022-07-25 MED ORDER — AZITHROMYCIN 500 MG PO TABS: 500.0000 mg | ORAL_TABLET | Freq: Every day | ORAL | 0 refills | Status: AC

## 2022-07-25 NOTE — Telephone Encounter (Signed)
Used interpreter service ID [phone]. Called patient and they verbalized understanding on instructions. She will pick up medication at the pharmacy

## 2022-07-25 NOTE — Telephone Encounter (Signed)
-----   Message from Lin Landsman, MD sent at 07/24/2022  3:27 PM EDT ----- Stool studies came back positive for E. coli.  Recommend azithromycin 500 mg daily for 3 days  RV

## 2022-07-29 LAB — GI PROFILE, STOOL, PCR

## 2022-07-29 LAB — CALPROTECTIN, FECAL: Calprotectin, Fecal: 49 ug/g (ref 0–120)

## 2022-08-01 ENCOUNTER — Ambulatory Visit
Admission: RE | Admit: 2022-08-01 | Discharge: 2022-08-01 | Disposition: A | Discharge status: Home / Self Care | Payer: Medicare Other | Source: Ambulatory Visit | Attending: Nurse Practitioner | Admitting: Nurse Practitioner

## 2022-08-01 DIAGNOSIS — Z780 Asymptomatic menopausal state: Secondary | ICD-10-CM | POA: Diagnosis present

## 2022-09-17 ENCOUNTER — Ambulatory Visit (INDEPENDENT_AMBULATORY_CARE_PROVIDER_SITE_OTHER): Payer: Medicare Other | Admitting: Gastroenterology

## 2022-09-17 ENCOUNTER — Encounter: Payer: Self-pay | Admitting: Gastroenterology

## 2022-09-17 VITALS — BP 113/76 | HR 72 | Temp 98.1°F | Ht <= 58 in | Wt 136.2 lb

## 2022-09-17 DIAGNOSIS — Z862 Personal history of diseases of the blood and blood-forming organs and certain disorders involving the immune mechanism: Secondary | ICD-10-CM

## 2022-09-17 DIAGNOSIS — Z87898 Personal history of other specified conditions: Secondary | ICD-10-CM

## 2022-09-17 NOTE — Progress Notes (Signed)
Cephas Darby, MD [address]  [place]  [place], [place] [address]  Main: [phone]  Fax: [phone]    Gastroenterology Consultation  Referring Provider:     No ref. provider found Primary Care Physician:  Ane Payment, PA (Inactive) Primary Gastroenterologist:  Dr. Cephas Darby Reason for Consultation: Chronic diarrhea and iron deficiency anemia        HPI:   Virginia Esparza is a 66 y.o. female referred by Dr. Norlene Duel, Felicity Coyer, [place] (Inactive)  for consultation & management of chronic iron deficiency anemia.  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  Patient also has some mild chronic iron deficiency anemia.  Her serum ferritin levels are 15 and 03/2022.  Patient reports having significant postprandial diarrhea for last several months, 5-6 bowel movements daily, without any rectal bleeding, denies any nocturnal diarrhea, denies nausea, vomiting, abdominal pain.  Patient has been gradually losing weight within last 6 months although she denies any loss of appetite.  She never had an endoscopic evaluation for her GI symptoms.  Her diarrhea is attributed to hydroxyurea.  Follow-up visit 09/17/2022 Patient is here for follow-up of diarrhea and iron deficiency anemia.  She underwent workup which revealed E. coli based on stool studies.  Subsequently treated with azithromycin.  Her diarrhea has resolved.  Her weight loss has stabilized and in fact gained 3 pounds.  Her anemia has resolved.  She is no longer experiencing any GI symptoms.  Patient is accompanied by her son today.  NSAIDs: None  Antiplts/Anticoagulants/Anti thrombotics: None  GI Procedures:  Upper endoscopy and colonoscopy 07/12/2022 - Normal duodenal bulb and second portion of the duodenum. Biopsied. - Erosive gastropathy with no bleeding and no stigmata of recent  bleeding. Biopsied. - Erythematous mucosa in the gastric body and antrum. Biopsied. - Esophagogastric landmarks identified. - LA Grade A reflux esophagitis with no bleeding.  - The examined portion of the ileum was normal. - Normal mucosa in the entire examined colon. Biopsied. - The distal rectum and anal verge are normal on retroflexion view.  DIAGNOSIS: A.  DUODENUM; COLD FORCEPS: - DUODENAL MUCOSA WITH INTRAEPITHELIAL LYMPHOCYTES AND NORMAL VILLOUS ARCHITECTURE, NON-SPECIFIC. - SEE COMMENT. - NEGATIVE FOR DYSPLASIA AND MALIGNANCY.  B.  STOMACH; COLD FORCEPS: - ANTRAL MUCOSA WITH REACTIVE GASTRITIS, SUPERFICIAL VASCULAR CONGESTION, AND FOCAL MUCOSAL THROMBI. - OXYNTIC MUCOSA WITH SUPERFICIAL VASCULAR CONGESTION. - SEE COMMENT. - NEGATIVE FOR H. PYLORI, INTESTINAL METAPLASIA, DYSPLASIA, AND MALIGNANCY.  C.  COLON, RANDOM; COLD FORCEPS: - UNREMARKABLE COLONIC MUCOSA. - NEGATIVE FOR MICROSCOPIC COLITIS, DYSPLASIA, AND MALIGNANCY.  Comment" Part A: Increased intraepithelial lymphocytes with normal villous architecture can be seen in symptomatic, latent, or partially treated gluten sensitivity/Celiac disease.  Other diagnostic considerations include NSAID use, viral enteritis, and systemic autoimmune disorders.  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 Correlation with endoscopic findings is required.   Past Medical History:  Diagnosis Date   Anemia    Diabetes mellitus without complication ([place])    prediabetic   Hyperlipemia    Hypertension    Thrombocytopenia ([place])    Wears dentures    full upper    Past Surgical History:  Procedure Laterality Date   BIOPSY N/A 07/12/2022   Procedure: BIOPSY;  Surgeon: Lin Landsman, MD;  Location: [facility];  Service: Endoscopy;  Laterality: N/A;  [place]   COLONOSCOPY WITH PROPOFOL  N/A 07/12/2022   Procedure: COLONOSCOPY WITH PROPOFOL;  Surgeon: Lin Landsman, MD;  Location: [facility];  Service: Endoscopy;  Laterality: N/A;   ESOPHAGOGASTRODUODENOSCOPY (EGD) WITH PROPOFOL N/A 07/12/2022   Procedure: ESOPHAGOGASTRODUODENOSCOPY (EGD) WITH PROPOFOL;  Surgeon: Lin Landsman, MD;  Location: [facility];  Service: Endoscopy;  Laterality: N/A;   TOE SURGERY       Current Outpatient Medications:    aspirin 81 MG EC tablet, Take 81 mg by mouth daily., Disp: , Rfl:    atorvastatin (LIPITOR) 40 MG tablet, Take 40 mg by mouth daily as needed., Disp: , Rfl:    chlorthalidone (HYGROTON) 25 MG tablet, Take 25 mg by mouth daily., Disp: , Rfl:    hydroxyurea (HYDREA) 500 MG capsule, Take 1 capsule (500 mg total) by mouth daily. May take with food to minimize GI side effects., Disp: 30 capsule, Rfl: 3   lisinopril (ZESTRIL) 40 MG tablet, Take 40 mg by mouth daily., Disp: , Rfl:    metFORMIN (GLUCOPHAGE) 500 MG tablet, Take 500 mg by mouth 2 (two) times daily., Disp: , Rfl:    Family History  Problem Relation Age of Onset   Diabetes Mother      Social History   Tobacco Use   Smoking status: Never   Smokeless tobacco: Never  Vaping Use   Vaping Use: Never used  Substance Use Topics   Alcohol use: Never   Drug use: Never    Allergies as of 09/17/2022   (No Known Allergies)    Review of Systems:    All systems reviewed and negative except where noted in HPI.   Physical Exam:  BP 113/76 (BP Location: Left Arm, Patient Position: Sitting, Cuff Size: Normal)   Pulse 72   Temp 98.1 F (36.7 C) (Oral)   Ht '4\' 10"'$  (1.473 m)   Wt 136 lb 4 oz (61.8 kg)   BMI 28.48 kg/m  No LMP recorded. Patient is postmenopausal.  General:   Alert,  Well-developed, well-nourished, pleasant and cooperative in NAD Head:  Normocephalic and atraumatic. Eyes:  Sclera clear, no icterus.   Conjunctiva pink. Ears:  Normal auditory acuity. Nose:  No deformity,  discharge, or lesions. Mouth:  No deformity or lesions,oropharynx pink & moist. Neck:  Supple; no masses or thyromegaly. Lungs:  Respirations even and unlabored.  Clear throughout to auscultation.   No wheezes, crackles, or rhonchi. No acute distress. Heart:  Regular rate and rhythm; no murmurs, clicks, rubs, or gallops. Abdomen:  Normal bowel sounds. Soft, non-tender and non-distended without masses, hepatosplenomegaly or hernias noted.  No guarding or rebound tenderness.   Rectal: Not performed Msk:  Symmetrical without gross deformities. Good, equal movement & strength bilaterally. Pulses:  Normal pulses noted. Extremities:  No clubbing or edema.  No cyanosis. Neurologic:  Alert and oriented x3;  grossly normal neurologically. Skin:  Intact without significant lesions or rashes. No jaundice. Psych:  Alert and cooperative. Normal mood and affect.  Imaging Studies: Reviewed  Assessment and Plan:   Virginia Esparza is a 66 y.o. 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  Celiac disease panel was negative Stool studies were positive for E. coli,  s/p treatment Colonoscopy with TI evaluation and random colon biopsies were unremarkable Iron deficiency anemia has resolved, weight loss has resolved Do not recommend any further workup at this time  Follow up as needed   Cephas Darby, MD

## 2022-10-19 ENCOUNTER — Encounter: Payer: Self-pay | Admitting: Internal Medicine

## 2022-10-19 ENCOUNTER — Inpatient Hospital Stay: Payer: Medicare Other | Attending: Internal Medicine | Admitting: Internal Medicine

## 2022-10-19 ENCOUNTER — Inpatient Hospital Stay: Payer: Medicare Other

## 2022-10-19 VITALS — BP 102/67 | HR 71 | Resp 18 | Wt 136.0 lb

## 2022-10-19 DIAGNOSIS — Z7984 Long term (current) use of oral hypoglycemic drugs: Secondary | ICD-10-CM | POA: Insufficient documentation

## 2022-10-19 DIAGNOSIS — I10 Essential (primary) hypertension: Secondary | ICD-10-CM | POA: Insufficient documentation

## 2022-10-19 DIAGNOSIS — E785 Hyperlipidemia, unspecified: Secondary | ICD-10-CM | POA: Insufficient documentation

## 2022-10-19 DIAGNOSIS — D649 Anemia, unspecified: Secondary | ICD-10-CM | POA: Diagnosis not present

## 2022-10-19 DIAGNOSIS — Z79899 Other long term (current) drug therapy: Secondary | ICD-10-CM | POA: Diagnosis not present

## 2022-10-19 DIAGNOSIS — E119 Type 2 diabetes mellitus without complications: Secondary | ICD-10-CM | POA: Insufficient documentation

## 2022-10-19 DIAGNOSIS — D696 Thrombocytopenia, unspecified: Secondary | ICD-10-CM | POA: Diagnosis not present

## 2022-10-19 DIAGNOSIS — D473 Essential (hemorrhagic) thrombocythemia: Secondary | ICD-10-CM | POA: Insufficient documentation

## 2022-10-19 DIAGNOSIS — Z7982 Long term (current) use of aspirin: Secondary | ICD-10-CM | POA: Insufficient documentation

## 2022-10-19 LAB — COMPREHENSIVE METABOLIC PANEL
ALT: 15 U/L (ref 0–44)
AST: 22 U/L (ref 15–41)
Albumin: 4.1 g/dL (ref 3.5–5.0)
Alkaline Phosphatase: 51 U/L (ref 38–126)
Anion gap: 12 (ref 5–15)
BUN: 32 mg/dL — ABNORMAL HIGH (ref 8–23)
CO2: 26 mmol/L (ref 22–32)
Calcium: 8.9 mg/dL (ref 8.9–10.3)
Chloride: 100 mmol/L (ref 98–111)
Creatinine, Ser: 1.04 mg/dL — ABNORMAL HIGH (ref 0.44–1.00)
GFR, Estimated: 59 mL/min — ABNORMAL LOW (ref >60)
Glucose, Bld: 134 mg/dL — ABNORMAL HIGH (ref 70–99)
Potassium: 3.3 mmol/L — ABNORMAL LOW (ref 3.5–5.1)
Sodium: 138 mmol/L (ref 135–145)
Total Bilirubin: 0.6 mg/dL (ref 0.3–1.2)
Total Protein: 7.1 g/dL (ref 6.5–8.1)

## 2022-10-19 LAB — CBC WITH DIFFERENTIAL/PLATELET
Abs Immature Granulocytes: 0.03 10*3/uL (ref 0.00–0.07)
Basophils Absolute: 0.1 10*3/uL (ref 0.0–0.1)
Basophils Relative: 1 %
Eosinophils Absolute: 0.3 10*3/uL (ref 0.0–0.5)
Eosinophils Relative: 4 %
HCT: 35.6 % — ABNORMAL LOW (ref 36.0–46.0)
Hemoglobin: 12 g/dL (ref 12.0–15.0)
Immature Granulocytes: 0 %
Lymphocytes Relative: 26 %
Lymphs Abs: 2 10*3/uL (ref 0.7–4.0)
MCH: 31.3 pg (ref 26.0–34.0)
MCHC: 33.7 g/dL (ref 30.0–36.0)
MCV: 92.7 fL (ref 80.0–100.0)
Monocytes Absolute: 0.5 10*3/uL (ref 0.1–1.0)
Monocytes Relative: 6 %
Neutro Abs: 4.9 10*3/uL (ref 1.7–7.7)
Neutrophils Relative %: 63 %
Platelets: 572 10*3/uL — ABNORMAL HIGH (ref 150–400)
RBC: 3.84 MIL/uL — ABNORMAL LOW (ref 3.87–5.11)
RDW: 13.6 % (ref 11.5–15.5)
WBC: 7.8 10*3/uL (ref 4.0–10.5)
nRBC: 0 % (ref 0.0–0.2)

## 2022-10-19 LAB — LACTATE DEHYDROGENASE: LDH: 130 U/L (ref 98–192)

## 2022-10-19 MED ORDER — HYDROXYUREA 500 MG PO CAPS: ORAL_CAPSULE | ORAL | 3 refills | Status: DC

## 2022-10-19 NOTE — Assessment & Plan Note (Signed)
#   Essential thrombocytosis- JAK-2 POSITIVE. [place]- mild splenomegaly.  Currently on hydrea 500 mg/day [started Jan 2023].   # Today platelets are 572; since the platelets are consistently above 550; recommend Hydrea 500 mg once a day; however weekends-twice a day.  # Mild Anemia: Hb 11-12- monitor for now; defer to PCP for screening colonoscopies.   # DISPOSITION:  # follow up in 4 months MD;  labs-cbc/cmp/LDH--Dr.B

## 2022-10-19 NOTE — Progress Notes (Signed)
[facility] NOTE  Patient Care Team: Ane Payment, [place] (Inactive) as PCP - General (Physician Assistant) Cammie Sickle, MD as Consulting Physician (Oncology)  CHIEF COMPLAINTS/PURPOSE OF CONSULTATION: Essential thrombocytosis.   HEMATOLOGY HISTORY  Oncology History Overview Note  # THROMBOCYTOSIS [since 2008- Elevated platelets- 742 [2015]; Hb; white count]; NOV 2022- 810; Hb 12; WBC- WNL [PCP]-   # JAN 2023- JAK-2 positive; essential thrombocytosis-Jan 6th, 2022- hydrea 500 mg/day.   # Hx of Left LE DVT [in 5956]-    Essential thrombocythemia ([facility])  09/22/2021 Initial Diagnosis   Essential thrombocythemia ([facility])     HISTORY OF PRESENTING ILLNESS: Ambulating independently.  Accompanied with granddaughter; Interpreter.  Virginia Esparza 67 y.o.  female pleasant patient with history of JAK-2 positive ET on hydrea is here for follow-up.  Patient here for follow up, she has no questions or concerns today. Patient denies any nausea vomiting.  Any fevers or chills.  Denies any weight loss.  Mild loose stools.  Patient admits to compliance with Hydrea.  Review of Systems  Constitutional:  Negative for chills, diaphoresis, fever, malaise/fatigue and weight loss.  HENT:  Negative for nosebleeds and sore throat.   Eyes:  Negative for double vision.  Respiratory:  Negative for cough, hemoptysis, sputum production, shortness of breath and wheezing.   Cardiovascular:  Negative for chest pain, palpitations, orthopnea and leg swelling.  Gastrointestinal:  Negative for abdominal pain, blood in stool, constipation, diarrhea, heartburn, melena, nausea and vomiting.  Genitourinary:  Negative for dysuria, frequency and urgency.  Musculoskeletal:  Positive for joint pain. Negative for back pain.  Skin: Negative.  Negative for itching and rash.  Neurological:  Negative for dizziness, tingling, focal weakness, weakness and headaches.   Endo/Heme/Allergies:  Does not bruise/bleed easily.  Psychiatric/Behavioral:  Negative for depression. The patient is not nervous/anxious and does not have insomnia.      MEDICAL HISTORY:  Past Medical History:  Diagnosis Date   Anemia    Diabetes mellitus without complication (HCC)    prediabetic   Gastric erosion    Gastric erythema    Hyperlipemia    Hypertension    Thrombocytopenia ([place])    Wears dentures    full upper    SURGICAL HISTORY: Past Surgical History:  Procedure Laterality Date   BIOPSY N/A 07/12/2022   Procedure: BIOPSY;  Surgeon: Lin Landsman, MD;  Location: [facility];  Service: Endoscopy;  Laterality: N/A;   [place]   COLONOSCOPY WITH PROPOFOL N/A 07/12/2022   Procedure: COLONOSCOPY WITH PROPOFOL;  Surgeon: Lin Landsman, MD;  Location: [facility];  Service: Endoscopy;  Laterality: N/A;   ESOPHAGOGASTRODUODENOSCOPY (EGD) WITH PROPOFOL N/A 07/12/2022   Procedure: ESOPHAGOGASTRODUODENOSCOPY (EGD) WITH PROPOFOL;  Surgeon: Lin Landsman, MD;  Location: [facility];  Service: Endoscopy;  Laterality: N/A;   TOE SURGERY      SOCIAL HISTORY: Social History   Socioeconomic History   Marital status: Married    Spouse name: Not on file   Number of children: Not on file   Years of education: Not on file   Highest education level: Not on file  Occupational History   Not on file  Tobacco Use   Smoking status: Never   Smokeless tobacco: Never  Vaping Use   Vaping Use: Never used  Substance and Sexual Activity   Alcohol use: Never   Drug use: Never   Sexual activity: Yes  Other  Topics Concern   Not on file  Social History 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 Lives in [place]- with husband/son.    Social Determinants of Health   Financial Resource Strain: Not on file  Food Insecurity: Not on file  Transportation Needs: Not on file  Physical  Activity: Not on file  Stress: Not on file  Social Connections: Not on file  Intimate Partner Violence: Not on file    FAMILY HISTORY: Family History  Problem Relation Age of Onset   Diabetes Mother     ALLERGIES:  has No Known Allergies.  MEDICATIONS:  Current Outpatient Medications  Medication Sig Dispense Refill   aspirin 81 MG EC tablet Take 81 mg by mouth daily.     atorvastatin (LIPITOR) 40 MG tablet Take 40 mg by mouth daily as needed.     chlorthalidone (HYGROTON) 25 MG tablet Take 25 mg by mouth daily.     lisinopril (ZESTRIL) 40 MG tablet Take 40 mg by mouth daily.     metFORMIN (GLUCOPHAGE) 500 MG tablet Take 500 mg by mouth 2 (two) times daily.     hydroxyurea (HYDREA) 500 MG capsule Monday thru Friday-Take one pill a day; On Sat-Sun- take 2 pills a day. May take with food to minimize GI side effects. 40 capsule 3   No current facility-administered medications for this visit.     PHYSICAL EXAMINATION:   Vitals:   10/19/22 1526  BP: 102/67  Pulse: 71  Resp: 18  SpO2: 100%   Filed Weights   10/19/22 1522  Weight: 136 lb (61.7 kg)    Physical Exam Vitals and nursing note reviewed.  HENT:     Head: Normocephalic and atraumatic.     Mouth/Throat:     Pharynx: Oropharynx is clear.  Eyes:     Extraocular Movements: Extraocular movements intact.     Pupils: Pupils are equal, round, and reactive to light.  Cardiovascular:     Rate and Rhythm: Normal rate and regular rhythm.  Pulmonary:     Comments: Decreased breath sounds bilaterally.  Abdominal:     Palpations: Abdomen is soft.  Musculoskeletal:        General: Normal range of motion.     Cervical back: Normal range of motion.  Skin:    General: Skin is warm.  Neurological:     General: No focal deficit present.     Mental Status: She is alert and oriented to person, place, and time.  Psychiatric:        Behavior: Behavior normal.        Judgment: Judgment normal.      LABORATORY DATA:  I  have reviewed the data as listed Lab Results  Component Value Date   WBC 7.8 10/19/2022   HGB 12.0 10/19/2022   HCT 35.6 (L) 10/19/2022   MCV 92.7 10/19/2022   PLT 572 (H) 10/19/2022   Recent Labs    03/19/22 0952 06/19/22 1316 10/19/22 1514  NA 139 139 138  K 3.8 3.8 3.3*  CL 105 102 100  CO2 '30 28 26  '$ GLUCOSE 102* 88 134*  BUN 23 26* 32*  CREATININE 0.93 0.93 1.04*  CALCIUM 9.0 9.1 8.9  GFRNONAA >60 >60 59*  PROT 7.1 7.2 7.1  ALBUMIN 3.7 3.9 4.1  AST '19 19 22  '$ ALT '17 19 15  '$ ALKPHOS 47 55 51  BILITOT 0.6 0.6 0.6     No results found.  ASSESSMENT & PLAN:   No  problem-specific Assessment & Plan notes found for this encounter.       Cammie Sickle, MD 10/19/2022 3:59 PM

## 2022-10-19 NOTE — Patient Instructions (Signed)
#   Take Hydrea 500 mg 1 pill a day Monday through Friday; 2 pills on Saturday and Sunday.

## 2022-10-19 NOTE — Progress Notes (Signed)
Patient here for follow up, she has no questions or concerns today.

## 2023-02-18 ENCOUNTER — Inpatient Hospital Stay: Payer: Medicare Other | Attending: Internal Medicine

## 2023-02-18 ENCOUNTER — Encounter: Payer: Self-pay | Admitting: Internal Medicine

## 2023-02-18 ENCOUNTER — Inpatient Hospital Stay (HOSPITAL_BASED_OUTPATIENT_CLINIC_OR_DEPARTMENT_OTHER): Payer: Medicare Other | Admitting: Internal Medicine

## 2023-02-18 VITALS — BP 112/59 | HR 77 | Temp 96.5°F | Ht <= 58 in | Wt 138.6 lb

## 2023-02-18 DIAGNOSIS — I10 Essential (primary) hypertension: Secondary | ICD-10-CM | POA: Insufficient documentation

## 2023-02-18 DIAGNOSIS — D649 Anemia, unspecified: Secondary | ICD-10-CM | POA: Diagnosis not present

## 2023-02-18 DIAGNOSIS — E119 Type 2 diabetes mellitus without complications: Secondary | ICD-10-CM | POA: Diagnosis not present

## 2023-02-18 DIAGNOSIS — D473 Essential (hemorrhagic) thrombocythemia: Secondary | ICD-10-CM | POA: Diagnosis not present

## 2023-02-18 DIAGNOSIS — Z79899 Other long term (current) drug therapy: Secondary | ICD-10-CM | POA: Diagnosis not present

## 2023-02-18 DIAGNOSIS — Z7984 Long term (current) use of oral hypoglycemic drugs: Secondary | ICD-10-CM | POA: Insufficient documentation

## 2023-02-18 DIAGNOSIS — Z86718 Personal history of other venous thrombosis and embolism: Secondary | ICD-10-CM | POA: Diagnosis not present

## 2023-02-18 DIAGNOSIS — E785 Hyperlipidemia, unspecified: Secondary | ICD-10-CM | POA: Diagnosis not present

## 2023-02-18 DIAGNOSIS — Z7982 Long term (current) use of aspirin: Secondary | ICD-10-CM | POA: Diagnosis not present

## 2023-02-18 LAB — COMPREHENSIVE METABOLIC PANEL
ALT: 20 U/L (ref 0–44)
AST: 22 U/L (ref 15–41)
Albumin: 3.9 g/dL (ref 3.5–5.0)
Alkaline Phosphatase: 49 U/L (ref 38–126)
Anion gap: 10 (ref 5–15)
BUN: 29 mg/dL — ABNORMAL HIGH (ref 8–23)
CO2: 26 mmol/L (ref 22–32)
Calcium: 9.1 mg/dL (ref 8.9–10.3)
Chloride: 105 mmol/L (ref 98–111)
Creatinine, Ser: 0.93 mg/dL (ref 0.44–1.00)
GFR, Estimated: >60 mL/min (ref >60)
Glucose, Bld: 98 mg/dL (ref 70–99)
Potassium: 3.4 mmol/L — ABNORMAL LOW (ref 3.5–5.1)
Sodium: 141 mmol/L (ref 135–145)
Total Bilirubin: 0.6 mg/dL (ref 0.3–1.2)
Total Protein: 6.9 g/dL (ref 6.5–8.1)

## 2023-02-18 LAB — CBC WITH DIFFERENTIAL/PLATELET
Abs Immature Granulocytes: 0.03 10*3/uL (ref 0.00–0.07)
Basophils Absolute: 0 10*3/uL (ref 0.0–0.1)
Basophils Relative: 1 %
Eosinophils Absolute: 0.2 10*3/uL (ref 0.0–0.5)
Eosinophils Relative: 3 %
HCT: 33.3 % — ABNORMAL LOW (ref 36.0–46.0)
Hemoglobin: 11.2 g/dL — ABNORMAL LOW (ref 12.0–15.0)
Immature Granulocytes: 0 %
Lymphocytes Relative: 25 %
Lymphs Abs: 1.7 10*3/uL (ref 0.7–4.0)
MCH: 33.1 pg (ref 26.0–34.0)
MCHC: 33.6 g/dL (ref 30.0–36.0)
MCV: 98.5 fL (ref 80.0–100.0)
Monocytes Absolute: 0.4 10*3/uL (ref 0.1–1.0)
Monocytes Relative: 6 %
Neutro Abs: 4.4 10*3/uL (ref 1.7–7.7)
Neutrophils Relative %: 65 %
Platelets: 391 10*3/uL (ref 150–400)
RBC: 3.38 MIL/uL — ABNORMAL LOW (ref 3.87–5.11)
RDW: 14.5 % (ref 11.5–15.5)
WBC: 6.8 10*3/uL (ref 4.0–10.5)
nRBC: 0 % (ref 0.0–0.2)

## 2023-02-18 LAB — LACTATE DEHYDROGENASE: LDH: 145 U/L (ref 98–192)

## 2023-02-18 MED ORDER — HYDROXYUREA 500 MG PO CAPS: ORAL_CAPSULE | ORAL | 6 refills | Status: DC

## 2023-02-18 NOTE — Assessment & Plan Note (Addendum)
#   Essential thrombocytosis- JAK-2 POSITIVE. [place]- mild splenomegaly.  Currently on hydrea [started Jan 2023].   # currently Hydrea 500 mg once a day; however weekends-twice a day.- Today platelets are 391; Hb 11.7- stable. Continue current dose of hydrea.   # Mild Anemia: Hb 11-12- monitor for now; SEP 2023 EGD/colonoscopies- .   # DISPOSITION:  # follow up in 6 months MD;  labs-cbc/cmp/LDH--Dr.B

## 2023-02-18 NOTE — Progress Notes (Signed)
Bruising: no Bleeding from gums: no  No concerns today.

## 2023-02-18 NOTE — Progress Notes (Signed)
[facility] CONSULT NOTE  Patient Care Team: Delman Cheadle, PA (Inactive) as PCP - General (Physician Assistant) Earna Coder, MD as Consulting Physician (Oncology)  CHIEF COMPLAINTS/PURPOSE OF CONSULTATION: Essential thrombocytosis.  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   # Hx of Left LE DVT [in 1994]-    Essential thrombocythemia (HCC)  09/22/2021 Initial Diagnosis   Essential thrombocythemia (HCC)     HISTORY OF PRESENTING ILLNESS: Ambulating independently.  Accompanied with  interpreter.  Virginia Esparza 67 y.o.  female pleasant patient with history of JAK-2 positive ET on hydrea is here for follow-up.  Patient here for follow up, she has no questions or concerns today.   Patient denies any nausea vomiting.  Any fevers or chills.  Denies any weight loss.   Patient admits to compliance with Hydrea.  Review of Systems  Constitutional:  Negative for chills, diaphoresis, fever, malaise/fatigue and weight loss.  HENT:  Negative for nosebleeds and sore throat.   Eyes:  Negative for double vision.  Respiratory:  Negative for cough, hemoptysis, sputum production, shortness of breath and wheezing.   Cardiovascular:  Negative for chest pain, palpitations, orthopnea and leg swelling.  Gastrointestinal:  Negative for abdominal pain, blood in stool, constipation, diarrhea, heartburn, melena, nausea and vomiting.  Genitourinary:  Negative for dysuria, frequency and urgency.  Musculoskeletal:  Positive for joint pain. Negative for back pain.  Skin: Negative.  Negative for itching and rash.  Neurological:  Negative for dizziness, tingling, focal weakness, weakness and headaches.  Endo/Heme/Allergies:  Does not bruise/bleed  easily.  Psychiatric/Behavioral:  Negative for depression. The patient is not nervous/anxious and does not have insomnia.      MEDICAL HISTORY:  Past Medical History:  Diagnosis Date   Anemia    Diabetes mellitus without complication (HCC)    prediabetic   Gastric erosion    Gastric erythema    Hyperlipemia    Hypertension    Thrombocytopenia (HCC)    Wears dentures    full upper    SURGICAL HISTORY: Past Surgical History:  Procedure Laterality Date   BIOPSY N/A 07/12/2022   Procedure: BIOPSY;  Surgeon: Toney Reil, MD;  [facility];  Service: Endoscopy;  Laterality: N/A;   CESAREAN SECTION  1981   CESAREAN SECTION  1995   COLONOSCOPY WITH PROPOFOL N/A 07/12/2022   Procedure: COLONOSCOPY WITH PROPOFOL;  Surgeon: Toney Reil, MD;  Location: [facility];  Service: Endoscopy;  Laterality: N/A;   ESOPHAGOGASTRODUODENOSCOPY (EGD) WITH PROPOFOL N/A 07/12/2022   Procedure: ESOPHAGOGASTRODUODENOSCOPY (EGD) WITH PROPOFOL;  Surgeon: Toney Reil, MD;  Location: [facility];  Service: Endoscopy;  Laterality: N/A;   TOE SURGERY      SOCIAL HISTORY: Social History   Socioeconomic History   Marital status: Married    Spouse name: Not on file   Number of children: Not on file   Years of education: Not on file   Highest education level: Not on file  Occupational History   Not on file  Tobacco Use   Smoking status: Never   Smokeless tobacco: Never  Vaping Use   Vaping Use: Never used  Substance and Sexual Activity   Alcohol use: Never   Drug use: Never   Sexual activity: Yes  Other Topics Concern  Not on file  Social History 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    Social Determinants of Health   Financial Resource Strain: Not on file  Food Insecurity: Not on file  Transportation Needs: Not on file  Physical Activity: Not on file  Stress: Not on file   Social Connections: Not on file  Intimate Partner Violence: Not on file    FAMILY HISTORY: Family History  Problem Relation Age of Onset   Diabetes Mother     ALLERGIES:  has No Known Allergies.  MEDICATIONS:  Current Outpatient Medications  Medication Sig Dispense Refill   aspirin 81 MG EC tablet Take 81 mg by mouth daily.     atorvastatin (LIPITOR) 40 MG tablet Take 40 mg by mouth daily as needed.     chlorthalidone (HYGROTON) 25 MG tablet Take 25 mg by mouth daily.     lisinopril (ZESTRIL) 40 MG tablet Take 40 mg by mouth daily.     metFORMIN (GLUCOPHAGE) 500 MG tablet Take 500 mg by mouth 2 (two) times daily.     hydroxyurea (HYDREA) 500 MG capsule Monday thru Friday-Take one pill a day; On Sat-Sun- take 2 pills a day. May take with food to minimize GI side effects. 40 capsule 6   No current facility-administered medications for this visit.     PHYSICAL EXAMINATION:   Vitals:   02/18/23 1423 02/18/23 1437  BP: (!) 164/71 (!) 112/59  Pulse: 77   Temp: (!) 96.5 F (35.8 C)   SpO2: 98%     Filed Weights   02/18/23 1423  Weight: 138 lb 9.6 oz (62.9 kg)     Physical Exam Vitals and nursing note reviewed.  HENT:     Head: Normocephalic and atraumatic.     Mouth/Throat:     Pharynx: Oropharynx is clear.  Eyes:     Extraocular Movements: Extraocular movements intact.     Pupils: Pupils are equal, round, and reactive to light.  Cardiovascular:     Rate and Rhythm: Normal rate and regular rhythm.  Pulmonary:     Comments: Decreased breath sounds bilaterally.  Abdominal:     Palpations: Abdomen is soft.  Musculoskeletal:        General: Normal range of motion.     Cervical back: Normal range of motion.  Skin:    General: Skin is warm.  Neurological:     General: No focal deficit present.     Mental Status: She is alert and oriented to person, place, and time.  Psychiatric:        Behavior: Behavior normal.        Judgment: Judgment normal.       LABORATORY DATA:  I have reviewed the data as listed Lab Results  Component Value Date   WBC 6.8 02/18/2023   HGB 11.2 (L) 02/18/2023   HCT 33.3 (L) 02/18/2023   MCV 98.5 02/18/2023   PLT 391 02/18/2023   Recent Labs    06/19/22 1316 10/19/22 1514 02/18/23 1424  NA 139 138 141  K 3.8 3.3* 3.4*  CL 102 100 105  CO2 28 26 26   GLUCOSE 88 134* 98  BUN 26* 32* 29*  CREATININE 0.93 1.04* 0.93  CALCIUM 9.1 8.9 9.1  GFRNONAA >60 59* >60  PROT 7.2 7.1 6.9  ALBUMIN 3.9 4.1 3.9  AST 19 22 22   ALT 19 15 20   ALKPHOS 55 51 49  BILITOT 0.6 0.6 0.6  No results found.  ASSESSMENT & PLAN:   Essential thrombocythemia (HCC) # Essential thrombocytosis- JAK-2 POSITIVE. [place]- mild splenomegaly.  Currently on hydrea [started Jan 2023].   # currently Hydrea 500 mg once a day; however weekends-twice a day.- Today platelets are 391; Hb 11.7- stable. Continue current dose of hydrea.   # Mild Anemia: Hb 11-12- monitor for now; SEP 2023 EGD/colonoscopies- .   # DISPOSITION:  # follow up in 6 months MD;  labs-cbc/cmp/LDH--Dr.B       Earna Coder, MD 02/18/2023 3:00 PM

## 2023-08-21 ENCOUNTER — Ambulatory Visit: Payer: Medicare Other | Admitting: Internal Medicine

## 2023-08-21 ENCOUNTER — Other Ambulatory Visit: Payer: Medicare Other

## 2023-08-28 ENCOUNTER — Encounter: Payer: Self-pay | Admitting: Internal Medicine

## 2023-08-28 ENCOUNTER — Inpatient Hospital Stay (HOSPITAL_BASED_OUTPATIENT_CLINIC_OR_DEPARTMENT_OTHER): Payer: Medicare Other | Admitting: Internal Medicine

## 2023-08-28 ENCOUNTER — Inpatient Hospital Stay: Payer: Medicare Other | Attending: Internal Medicine

## 2023-08-28 DIAGNOSIS — Z79899 Other long term (current) drug therapy: Secondary | ICD-10-CM | POA: Insufficient documentation

## 2023-08-28 DIAGNOSIS — Z86718 Personal history of other venous thrombosis and embolism: Secondary | ICD-10-CM | POA: Diagnosis not present

## 2023-08-28 DIAGNOSIS — D473 Essential (hemorrhagic) thrombocythemia: Secondary | ICD-10-CM | POA: Diagnosis not present

## 2023-08-28 DIAGNOSIS — Z7982 Long term (current) use of aspirin: Secondary | ICD-10-CM | POA: Insufficient documentation

## 2023-08-28 LAB — CMP (CANCER CENTER ONLY)
ALT: 23 U/L (ref 0–44)
AST: 22 U/L (ref 15–41)
Albumin: 3.9 g/dL (ref 3.5–5.0)
Alkaline Phosphatase: 44 U/L (ref 38–126)
Anion gap: 7 (ref 5–15)
BUN: 21 mg/dL (ref 8–23)
CO2: 29 mmol/L (ref 22–32)
Calcium: 9.6 mg/dL (ref 8.9–10.3)
Chloride: 102 mmol/L (ref 98–111)
Creatinine: 0.96 mg/dL (ref 0.44–1.00)
GFR, Estimated: >60 mL/min (ref >60)
Glucose, Bld: 99 mg/dL (ref 70–99)
Potassium: 3.7 mmol/L (ref 3.5–5.1)
Sodium: 138 mmol/L (ref 135–145)
Total Bilirubin: 0.7 mg/dL (ref <1.2)
Total Protein: 7.1 g/dL (ref 6.5–8.1)

## 2023-08-28 LAB — CBC WITH DIFFERENTIAL (CANCER CENTER ONLY)
Abs Immature Granulocytes: 0.02 10*3/uL (ref 0.00–0.07)
Basophils Absolute: 0 10*3/uL (ref 0.0–0.1)
Basophils Relative: 1 %
Eosinophils Absolute: 0.1 10*3/uL (ref 0.0–0.5)
Eosinophils Relative: 3 %
HCT: 35.6 % — ABNORMAL LOW (ref 36.0–46.0)
Hemoglobin: 12 g/dL (ref 12.0–15.0)
Immature Granulocytes: 1 %
Lymphocytes Relative: 31 %
Lymphs Abs: 1.4 10*3/uL (ref 0.7–4.0)
MCH: 34.1 pg — ABNORMAL HIGH (ref 26.0–34.0)
MCHC: 33.7 g/dL (ref 30.0–36.0)
MCV: 101.1 fL — ABNORMAL HIGH (ref 80.0–100.0)
Monocytes Absolute: 0.2 10*3/uL (ref 0.1–1.0)
Monocytes Relative: 4 %
Neutro Abs: 2.6 10*3/uL (ref 1.7–7.7)
Neutrophils Relative %: 60 %
Platelet Count: 349 10*3/uL (ref 150–400)
RBC: 3.52 MIL/uL — ABNORMAL LOW (ref 3.87–5.11)
RDW: 14 % (ref 11.5–15.5)
WBC Count: 4.4 10*3/uL (ref 4.0–10.5)
nRBC: 0 % (ref 0.0–0.2)

## 2023-08-28 LAB — LACTATE DEHYDROGENASE: LDH: 129 U/L (ref 98–192)

## 2023-08-28 MED ORDER — HYDROXYUREA 500 MG PO CAPS: ORAL_CAPSULE | ORAL | 6 refills | Status: DC

## 2023-08-28 NOTE — Progress Notes (Signed)
Patient here for oncology follow-up appointment, expresses no complaints or concerns at this time.    

## 2023-08-28 NOTE — Progress Notes (Signed)
[facility] CONSULT NOTE  Patient Care Team: Delman Cheadle, PA (Inactive) as PCP - General (Physician Assistant) Earna Coder, MD as Consulting Physician (Oncology)  CHIEF COMPLAINTS/PURPOSE OF CONSULTATION: Essential thrombocytosis.  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   # Hx of Left LE DVT [in 1994]-    Essential thrombocythemia (HCC)  09/22/2021 Initial Diagnosis   Essential thrombocythemia (HCC)     HISTORY OF PRESENTING ILLNESS: Ambulating independently.  Accompanied with  interpreter.  Virginia Esparza 67 y.o.  female pleasant patient with history of JAK-2 positive ET on hydrea and asprin is here for follow-up.  Patient here for follow up, she has no questions or concerns today.  Patient denies any nausea vomiting.  Any fevers or chills.  Denies any weight loss.   Mild diarrhea- 1-2 times a week. Patient admits to compliance with Hydrea.  Review of Systems  Constitutional:  Negative for chills, diaphoresis, fever, malaise/fatigue and weight loss.  HENT:  Negative for nosebleeds and sore throat.   Eyes:  Negative for double vision.  Respiratory:  Negative for cough, hemoptysis, sputum production, shortness of breath and wheezing.   Cardiovascular:  Negative for chest pain, palpitations, orthopnea and leg swelling.  Gastrointestinal:  Negative for abdominal pain, blood in stool, constipation, diarrhea, heartburn, melena, nausea and vomiting.  Genitourinary:  Negative for dysuria, frequency and urgency.  Musculoskeletal:  Positive for joint pain. Negative for back pain.  Skin: Negative.  Negative for itching and rash.  Neurological:  Negative for dizziness, tingling, focal weakness, weakness and headaches.   Endo/Heme/Allergies:  Does not bruise/bleed easily.  Psychiatric/Behavioral:  Negative for depression. The patient is not nervous/anxious and does not have insomnia.      MEDICAL HISTORY:  Past Medical History:  Diagnosis Date   Anemia    Diabetes mellitus without complication (HCC)    prediabetic   Gastric erosion    Gastric erythema    Hyperlipemia    Hypertension    Thrombocytopenia (HCC)    Wears dentures    full upper    SURGICAL HISTORY: Past Surgical History:  Procedure Laterality Date   BIOPSY N/A 07/12/2022   Procedure: BIOPSY;  Surgeon: Toney Reil, MD;  Location: [facility];  Service: Endoscopy;  Laterality: N/A;   CESAREAN SECTION  1981   CESAREAN SECTION  1995   COLONOSCOPY WITH PROPOFOL N/A 07/12/2022   Procedure: COLONOSCOPY WITH PROPOFOL;  Surgeon: Toney Reil, MD;  Location: [facility];  Service: Endoscopy;  Laterality: N/A;   ESOPHAGOGASTRODUODENOSCOPY (EGD) WITH PROPOFOL N/A 07/12/2022   Procedure: ESOPHAGOGASTRODUODENOSCOPY (EGD) WITH PROPOFOL;  Surgeon: Toney Reil, MD;  Location: [facility];  Service: Endoscopy;  Laterality: N/A;   TOE SURGERY      SOCIAL HISTORY: Social History   Socioeconomic History   Marital status: Married    Spouse name: Not on file   Number of children: Not on file   Years of education: Not on file   Highest education level: Not on file  Occupational History   Not on file  Tobacco Use   Smoking status: Never   Smokeless tobacco: Never  Vaping Use   Vaping status: Never Used  Substance and Sexual Activity   Alcohol use: Never   Drug use: Never  Sexual activity: Yes  Other Topics Concern   Not on file  Social History 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 Lives in [place]- with husband/son.    Social Determinants of Health   Financial Resource Strain: Not on file  Food Insecurity: Not on file  Transportation Needs: Not on file  Physical  Activity: Not on file  Stress: Not on file  Social Connections: Not on file  Intimate Partner Violence: Not on file    FAMILY HISTORY: Family History  Problem Relation Age of Onset   Diabetes Mother     ALLERGIES:  has No Known Allergies.  MEDICATIONS:  Current Outpatient Medications  Medication Sig Dispense Refill   aspirin 81 MG EC tablet Take 81 mg by mouth daily.     atorvastatin (LIPITOR) 40 MG tablet Take 40 mg by mouth daily as needed.     chlorthalidone (HYGROTON) 25 MG tablet Take 25 mg by mouth daily.     lisinopril (ZESTRIL) 40 MG tablet Take 40 mg by mouth daily.     metFORMIN (GLUCOPHAGE) 500 MG tablet Take 500 mg by mouth 2 (two) times daily.     hydroxyurea (HYDREA) 500 MG capsule Monday thru Friday-Take one pill a day; On Sat-Sun- take 2 pills a day. May take with food to minimize GI side effects. 40 capsule 6   No current facility-administered medications for this visit.     PHYSICAL EXAMINATION:   Vitals:   08/28/23 1001  BP: (!) 126/92  Pulse: 82  Resp: 17  Temp: (!) 97 F (36.1 C)  SpO2: 100%    Filed Weights   08/28/23 1001  Weight: 138 lb (62.6 kg)     Physical Exam Vitals and nursing note reviewed.  HENT:     Head: Normocephalic and atraumatic.     Mouth/Throat:     Pharynx: Oropharynx is clear.  Eyes:     Extraocular Movements: Extraocular movements intact.     Pupils: Pupils are equal, round, and reactive to light.  Cardiovascular:     Rate and Rhythm: Normal rate and regular rhythm.  Pulmonary:     Comments: Decreased breath sounds bilaterally.  Abdominal:     Palpations: Abdomen is soft.  Musculoskeletal:        General: Normal range of motion.     Cervical back: Normal range of motion.  Skin:    General: Skin is warm.  Neurological:     General: No focal deficit present.     Mental Status: She is alert and oriented to person, place, and time.  Psychiatric:        Behavior: Behavior normal.        Judgment: Judgment  normal.      LABORATORY DATA:  I have reviewed the data as listed Lab Results  Component Value Date   WBC 4.4 08/28/2023   HGB 12.0 08/28/2023   HCT 35.6 (L) 08/28/2023   MCV 101.1 (H) 08/28/2023   PLT 349 08/28/2023   Recent Labs    10/19/22 1514 02/18/23 1424 08/28/23 0931  NA 138 141 138  K 3.3* 3.4* 3.7  CL 100 105 102  CO2 26 26 29   GLUCOSE 134* 98 99  BUN 32* 29* 21  CREATININE 1.04* 0.93 0.96  CALCIUM 8.9 9.1 9.6  GFRNONAA 59* >60 >60  PROT 7.1 6.9 7.1  ALBUMIN 4.1 3.9 3.9  AST 22 22 22   ALT 15 20 23   ALKPHOS 51 49 44  BILITOT 0.6 0.6  0.7     No results found.  ASSESSMENT & PLAN:   Essential thrombocythemia (HCC) # Essential thrombocytosis- JAK-2 POSITIVE. [place]- mild splenomegaly.  Currently on hydrea [started Jan 2023]. On asprin 81 mg/day.   # currently Hydrea 500 mg once a day; however weekends-twice a day.- Today platelets are 349 Hb 12 stable. Continue current dose of hydrea.  SEP 2023 EGD/colonoscopies- .   # DISPOSITION:  # follow up in 6 months MD;  labs-cbc/cmp/LDH--Dr.B        Earna Coder, MD 08/28/2023 10:34 AM

## 2023-08-28 NOTE — Assessment & Plan Note (Addendum)
#   Essential thrombocytosis- JAK-2 POSITIVE. [place]- mild splenomegaly.  Currently on hydrea [started Jan 2023]. On asprin 81 mg/day.   # currently Hydrea 500 mg once a day; however weekends-twice a day.- Today platelets are 349 Hb 12 stable. Continue current dose of hydrea.  SEP 2023 EGD/colonoscopies- .   # DISPOSITION:  # follow up in 6 months MD;  labs-cbc/cmp/LDH--Dr.B

## 2023-09-13 IMAGING — MG MM DIGITAL SCREENING BILAT W/ TOMO AND CAD
6 of 10 series · 6 of 30 positions shown · non-contrast
Comparison: Previous exam(s).

CLINICAL DATA: Screening.

EXAM:
DIGITAL SCREENING BILATERAL MAMMOGRAM WITH TOMOSYNTHESIS AND CAD
TECHNIQUE: Bilateral screening digital craniocaudal and mediolateral oblique
mammograms were obtained. Bilateral screening digital breast
tomosynthesis was performed. The images were evaluated with
computer-aided detection.

[L CC synth-2D]
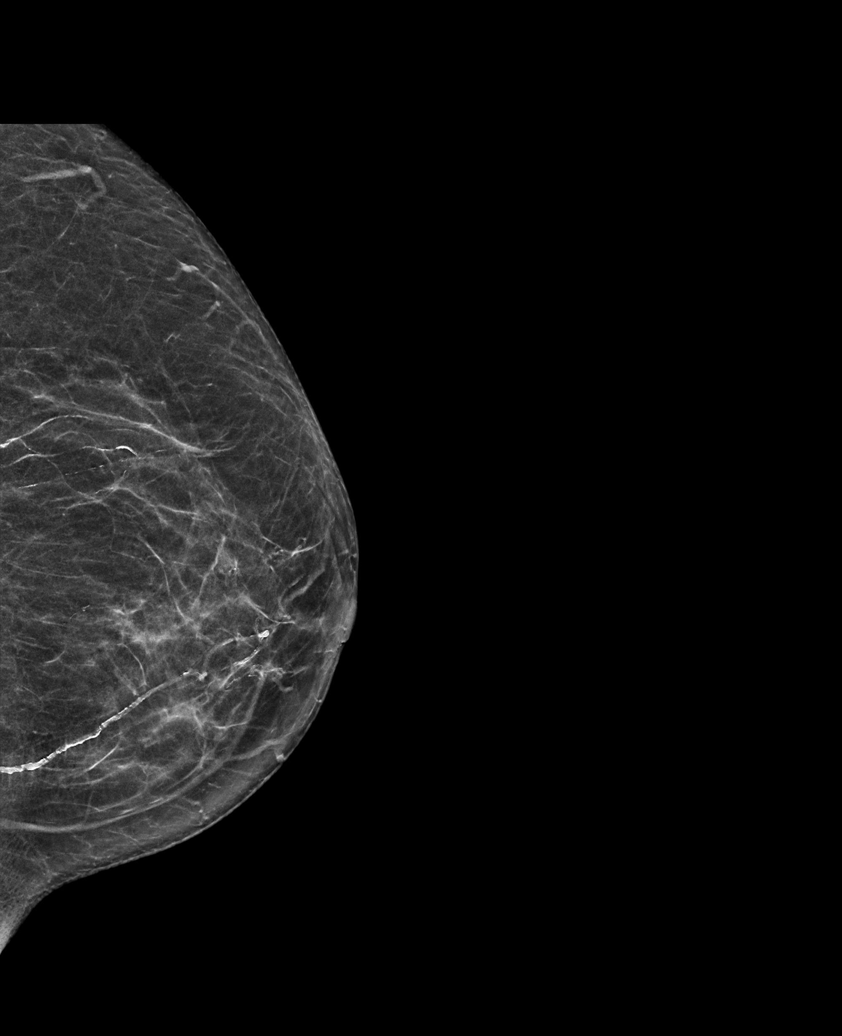

[L MLO synth-2D]
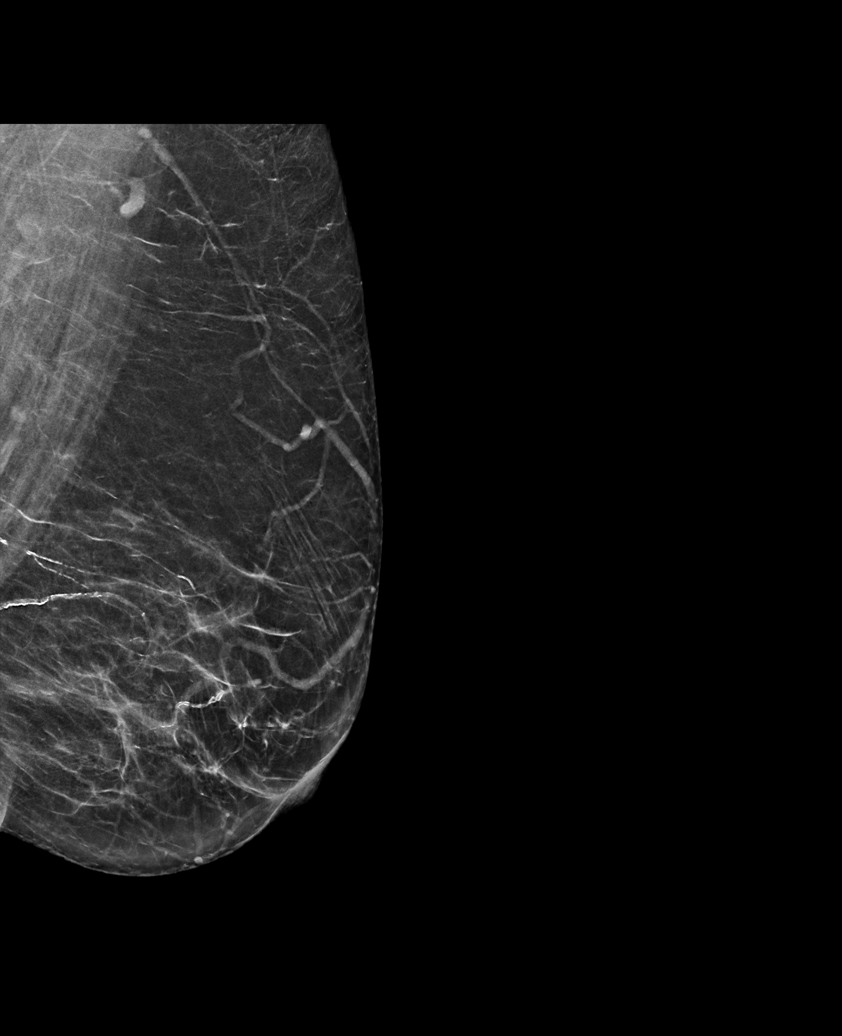

[R CC synth-2D (1 of 2)]
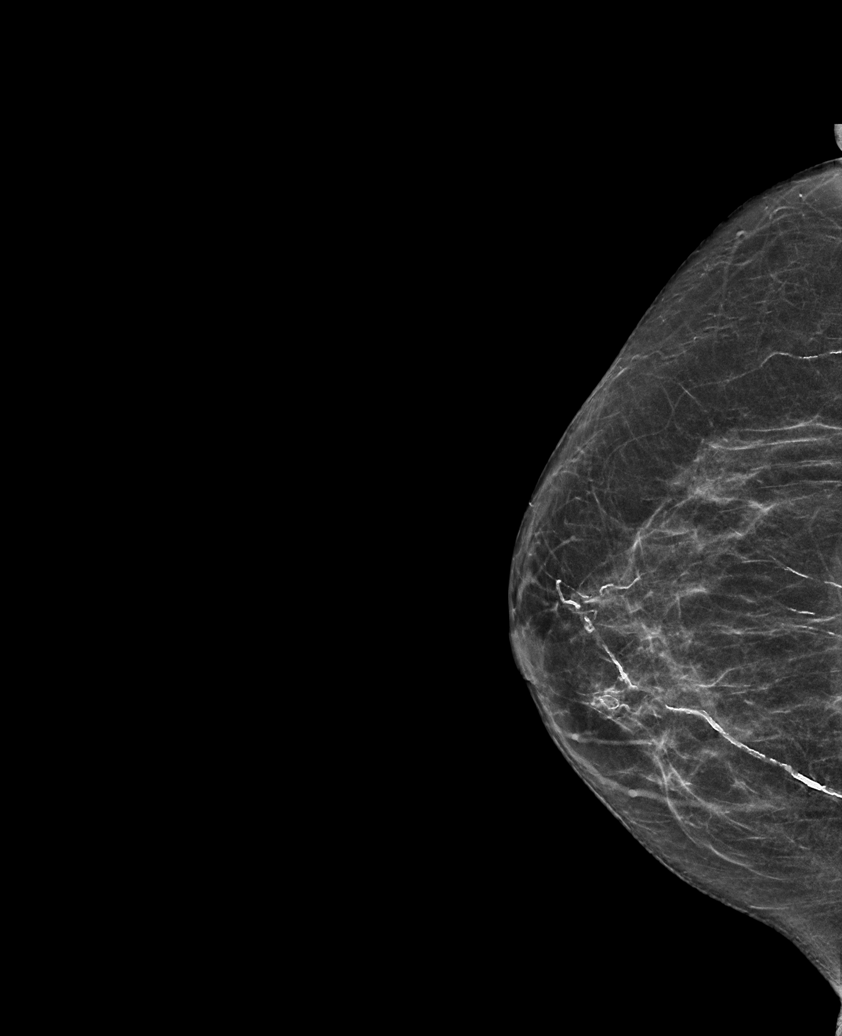

[R CC synth-2D (2 of 2)]
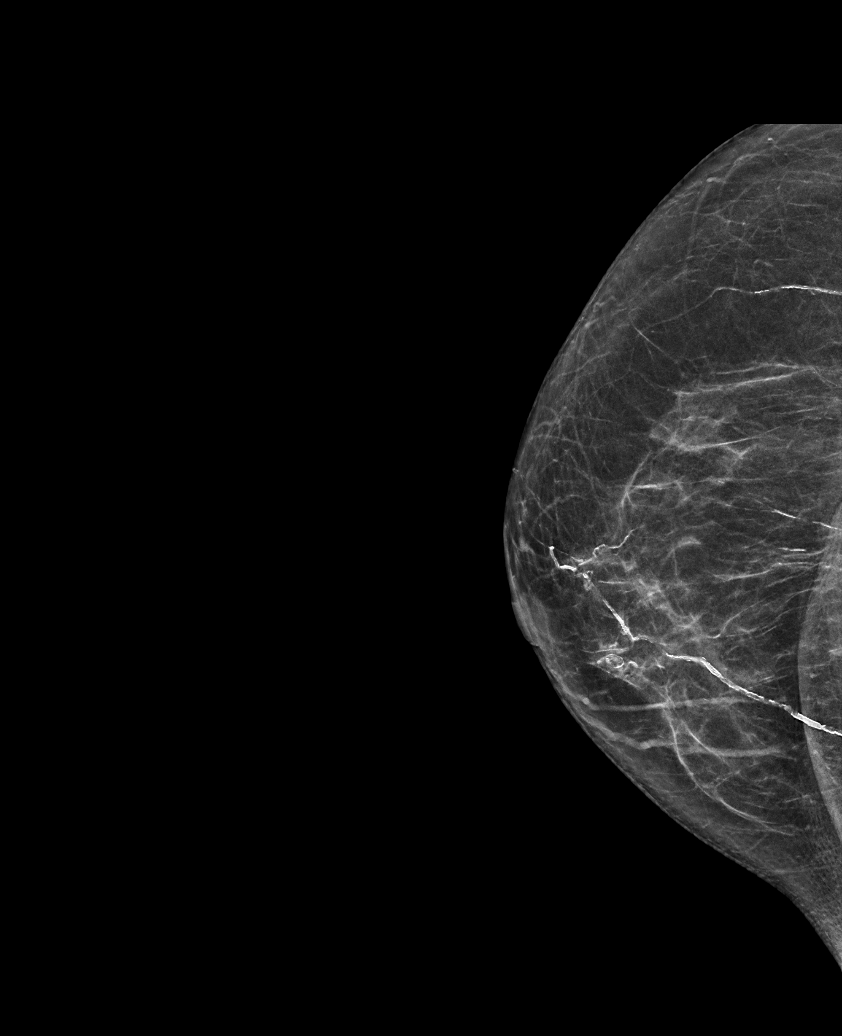

[R MLO synth-2D]
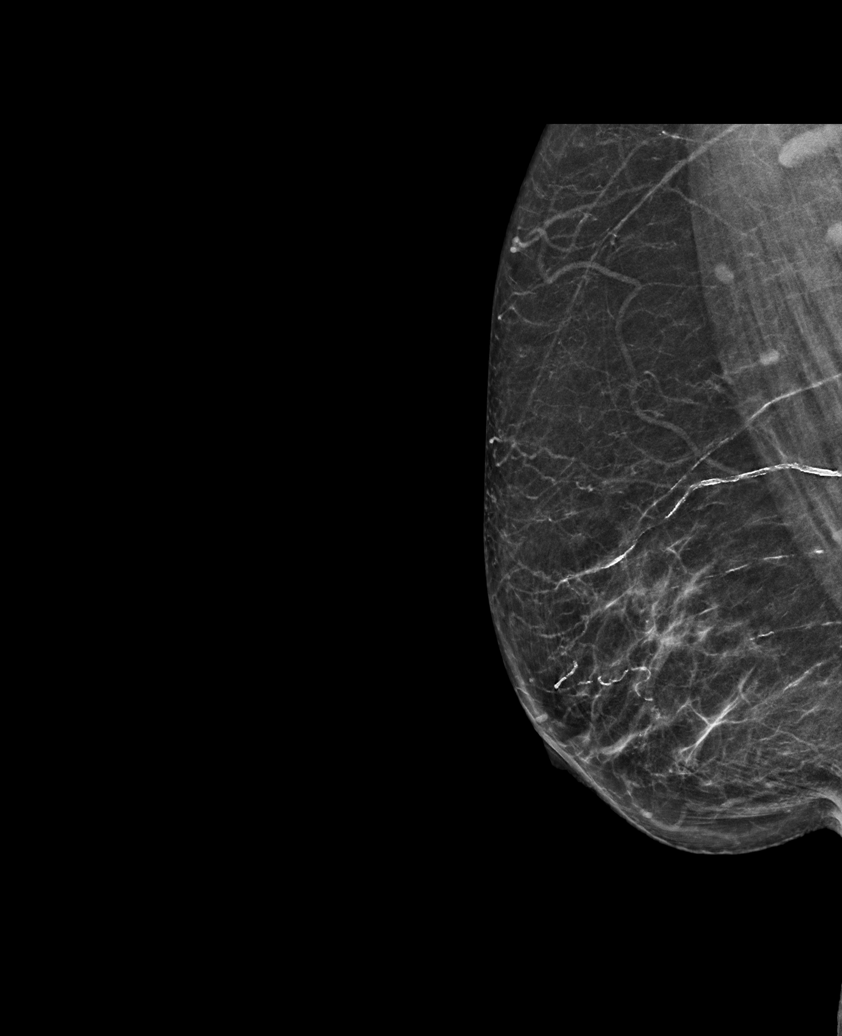

[R CC tomo · tomo slice 29/56.0]
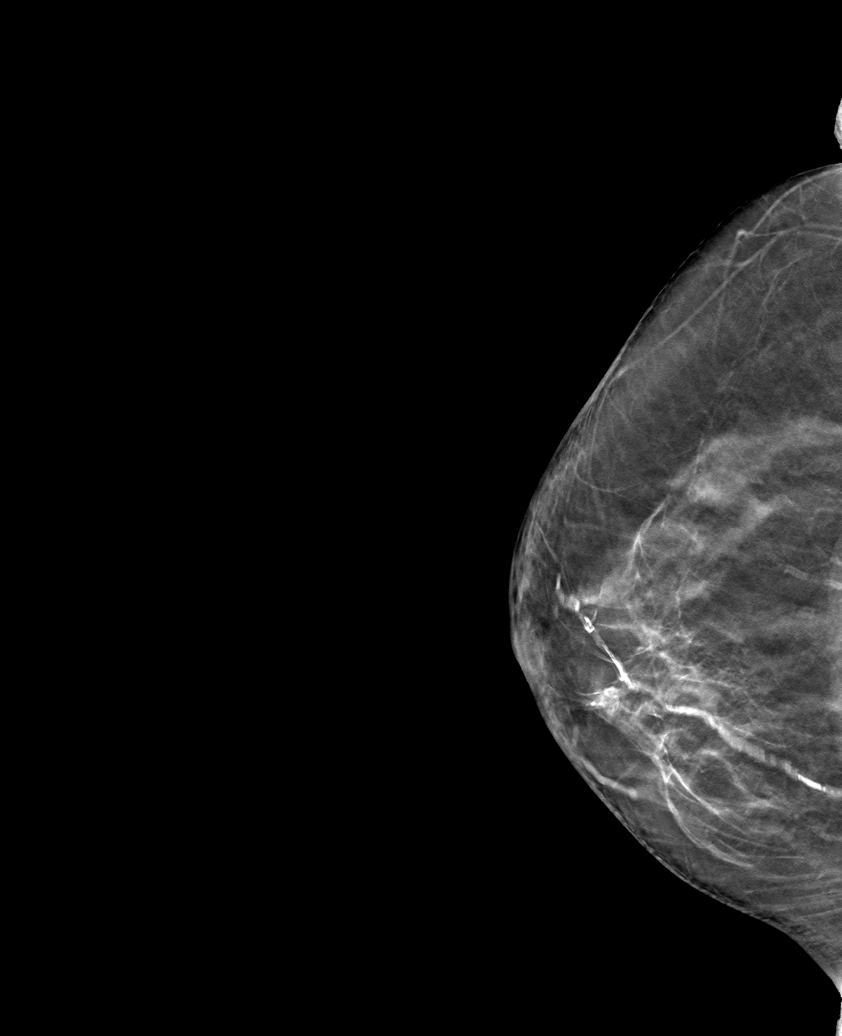

[6 of 30 positions shown; findings below may reference images not displayed]

ACR Breast Density Category b: There are scattered areas of
fibroglandular density.
FINDINGS: There are no findings suspicious for malignancy.
IMPRESSION: No mammographic evidence of malignancy. A result letter of this
screening mammogram will be mailed directly to the patient.

RECOMMENDATION:
Screening mammogram in one year. (Code:51-O-LD2)

BI-RADS CATEGORY  1: Negative.

## 2024-02-25 ENCOUNTER — Other Ambulatory Visit: Payer: Self-pay

## 2024-02-25 ENCOUNTER — Encounter: Payer: Self-pay | Admitting: Internal Medicine

## 2024-02-25 ENCOUNTER — Inpatient Hospital Stay (HOSPITAL_BASED_OUTPATIENT_CLINIC_OR_DEPARTMENT_OTHER): Payer: Medicare Other | Admitting: Internal Medicine

## 2024-02-25 ENCOUNTER — Inpatient Hospital Stay: Payer: Medicare Other | Attending: Internal Medicine

## 2024-02-25 VITALS — BP 140/59 | HR 76 | Temp 98.3°F | Resp 18 | Ht <= 58 in | Wt 132.0 lb

## 2024-02-25 DIAGNOSIS — B349 Viral infection, unspecified: Secondary | ICD-10-CM | POA: Insufficient documentation

## 2024-02-25 DIAGNOSIS — Z86718 Personal history of other venous thrombosis and embolism: Secondary | ICD-10-CM | POA: Diagnosis not present

## 2024-02-25 DIAGNOSIS — D473 Essential (hemorrhagic) thrombocythemia: Secondary | ICD-10-CM | POA: Insufficient documentation

## 2024-02-25 DIAGNOSIS — R112 Nausea with vomiting, unspecified: Secondary | ICD-10-CM | POA: Insufficient documentation

## 2024-02-25 DIAGNOSIS — D75839 Thrombocytosis, unspecified: Secondary | ICD-10-CM | POA: Diagnosis not present

## 2024-02-25 DIAGNOSIS — R634 Abnormal weight loss: Secondary | ICD-10-CM | POA: Diagnosis not present

## 2024-02-25 DIAGNOSIS — R161 Splenomegaly, not elsewhere classified: Secondary | ICD-10-CM | POA: Diagnosis not present

## 2024-02-25 LAB — IRON AND TIBC
Iron: 57 ug/dL (ref 28–170)
Saturation Ratios: 26 % (ref 10.4–31.8)
TIBC: 223 ug/dL — ABNORMAL LOW (ref 250–450)
UIBC: 166 ug/dL

## 2024-02-25 LAB — CMP (CANCER CENTER ONLY)
ALT: 40 U/L (ref 0–44)
AST: 45 U/L — ABNORMAL HIGH (ref 15–41)
Albumin: 2.7 g/dL — ABNORMAL LOW (ref 3.5–5.0)
Alkaline Phosphatase: 87 U/L (ref 38–126)
Anion gap: 8 (ref 5–15)
BUN: 19 mg/dL (ref 8–23)
CO2: 28 mmol/L (ref 22–32)
Calcium: 8.6 mg/dL — ABNORMAL LOW (ref 8.9–10.3)
Chloride: 103 mmol/L (ref 98–111)
Creatinine: 1.04 mg/dL — ABNORMAL HIGH (ref 0.44–1.00)
GFR, Estimated: 58 mL/min — ABNORMAL LOW (ref >60)
Glucose, Bld: 104 mg/dL — ABNORMAL HIGH (ref 70–99)
Potassium: 3.8 mmol/L (ref 3.5–5.1)
Sodium: 139 mmol/L (ref 135–145)
Total Bilirubin: 0.5 mg/dL (ref 0.0–1.2)
Total Protein: 6.6 g/dL (ref 6.5–8.1)

## 2024-02-25 LAB — CBC (CANCER CENTER ONLY)
HCT: 29.3 % — ABNORMAL LOW (ref 36.0–46.0)
Hemoglobin: 9.5 g/dL — ABNORMAL LOW (ref 12.0–15.0)
MCH: 32.4 pg (ref 26.0–34.0)
MCHC: 32.4 g/dL (ref 30.0–36.0)
MCV: 100 fL (ref 80.0–100.0)
Platelet Count: 323 10*3/uL (ref 150–400)
RBC: 2.93 MIL/uL — ABNORMAL LOW (ref 3.87–5.11)
RDW: 15.1 % (ref 11.5–15.5)
WBC Count: 5.8 10*3/uL (ref 4.0–10.5)
nRBC: 0 % (ref 0.0–0.2)

## 2024-02-25 LAB — LACTATE DEHYDROGENASE: LDH: 139 U/L (ref 98–192)

## 2024-02-25 LAB — FERRITIN: Ferritin: 72 ng/mL (ref 11–307)

## 2024-02-25 NOTE — Progress Notes (Signed)
 Patient is doing well, no new questions or concerns for the doctor today.

## 2024-02-25 NOTE — Progress Notes (Signed)
 [facility] CONSULT NOTE  Patient Care Team: Verma Gobble, PA (Inactive) as PCP - General (Physician Assistant) Gwyn Leos, MD as Consulting Physician (Oncology)  CHIEF COMPLAINTS/PURPOSE OF CONSULTATION: Essential thrombocytosis.  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   # Hx of Left LE DVT [in 1994]-    Essential thrombocythemia (HCC)  09/22/2021 Initial Diagnosis   Essential thrombocythemia (HCC)     HISTORY OF PRESENTING ILLNESS: Ambulating independently.  Accompanied with  interpreter.  Virginia Esparza 68 y.o.  female pleasant patient with history of JAK-2 positive ET on hydrea  and asprin is here for follow-up.  Patient lost weight. Patient noted to have diarrhea. Also noted to vomiting- nausea- in last 2-3 weeks ago. No fevers or chills. Patient admits to compliance with Hydrea .  Review of Systems  Constitutional:  Negative for chills, diaphoresis, fever, malaise/fatigue and weight loss.  HENT:  Negative for nosebleeds and sore throat.   Eyes:  Negative for double vision.  Respiratory:  Negative for cough, hemoptysis, sputum production, shortness of breath and wheezing.   Cardiovascular:  Negative for chest pain, palpitations, orthopnea and leg swelling.  Gastrointestinal:  Negative for abdominal pain, blood in stool, constipation, diarrhea, heartburn, melena, nausea and vomiting.  Genitourinary:  Negative for dysuria, frequency and urgency.  Musculoskeletal:  Positive for joint pain. Negative for back pain.  Skin: Negative.  Negative for itching and rash.  Neurological:  Negative for dizziness, tingling, focal weakness, weakness and headaches.  Endo/Heme/Allergies:  Does not bruise/bleed easily.   Psychiatric/Behavioral:  Negative for depression. The patient is not nervous/anxious and does not have insomnia.      MEDICAL HISTORY:  Past Medical History:  Diagnosis Date   Anemia    Diabetes mellitus without complication (HCC)    prediabetic   Gastric erosion    Gastric erythema    Hyperlipemia    Hypertension    Thrombocytopenia (HCC)    Wears dentures    full upper    SURGICAL HISTORY: Past Surgical History:  Procedure Laterality Date   BIOPSY N/A 07/12/2022   Procedure: BIOPSY;  Surgeon: Selena Daily, MD;  Location: [facility];  Service: Endoscopy;  Laterality: N/A;   CESAREAN SECTION  1981   CESAREAN SECTION  1995   COLONOSCOPY WITH PROPOFOL  N/A 07/12/2022   Procedure: COLONOSCOPY WITH PROPOFOL ;  Surgeon: Selena Daily, MD;  Location: [facility];  Service: Endoscopy;  Laterality: N/A;   ESOPHAGOGASTRODUODENOSCOPY (EGD) WITH PROPOFOL  N/A 07/12/2022   Procedure: ESOPHAGOGASTRODUODENOSCOPY (EGD) WITH PROPOFOL ;  Surgeon: Selena Daily, MD;  Location: [facility];  Service: Endoscopy;  Laterality: N/A;   TOE SURGERY      SOCIAL HISTORY: Social History   Socioeconomic History   Marital status: Married    Spouse name: Not on file   Number of children: Not on file   Years of education: Not on file   Highest education level: Not on file  Occupational History   Not on file  Tobacco Use   Smoking status: Never   Smokeless tobacco: Never  Vaping Use   Vaping status: Never Used  Substance and Sexual Activity   Alcohol use: Never   Drug use: Never   Sexual activity: Yes  Other Topics Concern   Not on file  Social  History 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    Social Drivers of Corporate investment banker Strain: Not on file  Food Insecurity: Not on file  Transportation Needs: Not on file  Physical Activity: Not on file  Stress: Not on file  Social  Connections: Not on file  Intimate Partner Violence: Not on file    FAMILY HISTORY: Family History  Problem Relation Age of Onset   Diabetes Mother     ALLERGIES:  has no known allergies.  MEDICATIONS:  Current Outpatient Medications  Medication Sig Dispense Refill   aspirin 81 MG EC tablet Take 81 mg by mouth daily.     atorvastatin (LIPITOR) 40 MG tablet Take 40 mg by mouth daily as needed.     chlorthalidone (HYGROTON) 25 MG tablet Take 25 mg by mouth daily.     hydroxyurea  (HYDREA ) 500 MG capsule Monday thru Friday-Take one pill a day; On Sat-Sun- take 2 pills a day. May take with food to minimize GI side effects. 40 capsule 6   lisinopril (ZESTRIL) 40 MG tablet Take 40 mg by mouth daily.     metFORMIN (GLUCOPHAGE) 500 MG tablet Take 500 mg by mouth 2 (two) times daily.     No current facility-administered medications for this visit.     PHYSICAL EXAMINATION:   Vitals:   02/25/24 0858  BP: (!) 140/59  Pulse: 76  Resp: 18  Temp: 98.3 F (36.8 C)  SpO2: 99%    Filed Weights   02/25/24 0858  Weight: 132 lb (59.9 kg)     Physical Exam Vitals and nursing note reviewed.  HENT:     Head: Normocephalic and atraumatic.     Mouth/Throat:     Pharynx: Oropharynx is clear.  Eyes:     Extraocular Movements: Extraocular movements intact.     Pupils: Pupils are equal, round, and reactive to light.  Cardiovascular:     Rate and Rhythm: Normal rate and regular rhythm.  Pulmonary:     Comments: Decreased breath sounds bilaterally.  Abdominal:     Palpations: Abdomen is soft.  Musculoskeletal:        General: Normal range of motion.     Cervical back: Normal range of motion.  Skin:    General: Skin is warm.  Neurological:     General: No focal deficit present.     Mental Status: She is alert and oriented to person, place, and time.  Psychiatric:        Behavior: Behavior normal.        Judgment: Judgment normal.      LABORATORY DATA:  I have reviewed the  data as listed Lab Results  Component Value Date   WBC 5.8 02/25/2024   HGB 9.5 (L) 02/25/2024   HCT 29.3 (L) 02/25/2024   MCV 100.0 02/25/2024   PLT 323 02/25/2024   Recent Labs    08/28/23 0931 02/25/24 0901  NA 138 139  K 3.7 3.8  CL 102 103  CO2 29 28  GLUCOSE 99 104*  BUN 21 19  CREATININE 0.96 1.04*  CALCIUM 9.6 8.6*  GFRNONAA >60 58*  PROT 7.1 6.6  ALBUMIN 3.9 2.7*  AST 22 45*  ALT 23 40  ALKPHOS 44 87  BILITOT 0.7 0.5     No results found.  ASSESSMENT & PLAN:   Essential thrombocythemia (HCC) # Essential thrombocytosis- JAK-2 POSITIVE. [place] - mild splenomegaly.  Currently on hydrea  [started Jan 2023].  On asprin 81 mg/day.   # currently Hydrea  500 mg once a day;  weekends-twice a day; However- given Hb 9- & sec ? GI issues].- recommend  hydrea  500 mg/day.  SEP 2023 EGD/colonoscopies.  Low clinical concern for transformation-monitor closely.  Follow-up closely in 2 months.   # Weight loss/ nausea-vomiting- ? Viral infection- less likely from hydrea . Will cut down hydrea  to 500 mg/day.  Will recheck back again in 2 months.  If continues to lose weight or anemia worsens would recommend further workup including a bone marrow biopsy ultrasound etc to rule out transformation.  Q66m- # DISPOSITION:  # ADD iron studies; ferritin # follow up in 2 months MD;  labs-cbc/cmp; -/LDH--Dr.B         Gwyn Leos, MD 02/25/2024 12:30 PM

## 2024-02-25 NOTE — Assessment & Plan Note (Addendum)
#   Essential thrombocytosis- JAK-2 POSITIVE. US - mild splenomegaly.  Currently on hydrea  [started Jan 2023]. On asprin 81 mg/day.   # currently Hydrea  500 mg once a day;  weekends-twice a day; However- given Hb 9- & sec ? GI issues].- recommend  hydrea  500 mg/day.  SEP 2023 EGD/colonoscopies.  Low clinical concern for transformation-monitor closely.  Follow-up closely in 2 months.   # Weight loss/ nausea-vomiting- ? Viral infection- less likely from hydrea . Will cut down hydrea  to 500 mg/day.  Will recheck back again in 2 months.  If continues to lose weight or anemia worsens would recommend further workup including a bone marrow biopsy ultrasound etc to rule out transformation.  Q33m- # DISPOSITION:  # ADD iron studies; ferritin # follow up in 2 months MD;  labs-cbc/cmp; -/LDH--Dr.B

## 2024-04-23 ENCOUNTER — Encounter: Payer: Self-pay | Admitting: Ophthalmology

## 2024-04-29 ENCOUNTER — Inpatient Hospital Stay (HOSPITAL_BASED_OUTPATIENT_CLINIC_OR_DEPARTMENT_OTHER): Admitting: Internal Medicine

## 2024-04-29 ENCOUNTER — Encounter: Payer: Self-pay | Admitting: Internal Medicine

## 2024-04-29 ENCOUNTER — Inpatient Hospital Stay: Attending: Internal Medicine

## 2024-04-29 VITALS — BP 115/71 | HR 74 | Temp 97.1°F | Resp 16 | Ht <= 58 in | Wt 128.0 lb

## 2024-04-29 DIAGNOSIS — D473 Essential (hemorrhagic) thrombocythemia: Secondary | ICD-10-CM

## 2024-04-29 DIAGNOSIS — Z79899 Other long term (current) drug therapy: Secondary | ICD-10-CM | POA: Diagnosis not present

## 2024-04-29 LAB — CBC WITH DIFFERENTIAL (CANCER CENTER ONLY)
Abs Immature Granulocytes: 0.02 K/uL (ref 0.00–0.07)
Basophils Absolute: 0 K/uL (ref 0.0–0.1)
Basophils Relative: 1 %
Eosinophils Absolute: 0.1 K/uL (ref 0.0–0.5)
Eosinophils Relative: 3 %
HCT: 30.9 % — ABNORMAL LOW (ref 36.0–46.0)
Hemoglobin: 9.6 g/dL — ABNORMAL LOW (ref 12.0–15.0)
Immature Granulocytes: 1 %
Lymphocytes Relative: 12 %
Lymphs Abs: 0.5 K/uL — ABNORMAL LOW (ref 0.7–4.0)
MCH: 31.2 pg (ref 26.0–34.0)
MCHC: 31.1 g/dL (ref 30.0–36.0)
MCV: 100.3 fL — ABNORMAL HIGH (ref 80.0–100.0)
Monocytes Absolute: 0.2 K/uL (ref 0.1–1.0)
Monocytes Relative: 4 %
Neutro Abs: 3.2 K/uL (ref 1.7–7.7)
Neutrophils Relative %: 79 %
Platelet Count: 344 K/uL (ref 150–400)
RBC: 3.08 MIL/uL — ABNORMAL LOW (ref 3.87–5.11)
RDW: 13.9 % (ref 11.5–15.5)
WBC Count: 4.1 K/uL (ref 4.0–10.5)
nRBC: 0 % (ref 0.0–0.2)

## 2024-04-29 LAB — CMP (CANCER CENTER ONLY)
ALT: 17 U/L (ref 0–44)
AST: 22 U/L (ref 15–41)
Albumin: 3.4 g/dL — ABNORMAL LOW (ref 3.5–5.0)
Alkaline Phosphatase: 83 U/L (ref 38–126)
Anion gap: 6 (ref 5–15)
BUN: 19 mg/dL (ref 8–23)
CO2: 27 mmol/L (ref 22–32)
Calcium: 9 mg/dL (ref 8.9–10.3)
Chloride: 104 mmol/L (ref 98–111)
Creatinine: 0.94 mg/dL (ref 0.44–1.00)
GFR, Estimated: >60 mL/min (ref >60)
Glucose, Bld: 99 mg/dL (ref 70–99)
Potassium: 3.7 mmol/L (ref 3.5–5.1)
Sodium: 137 mmol/L (ref 135–145)
Total Bilirubin: 0.6 mg/dL (ref 0.0–1.2)
Total Protein: 7.9 g/dL (ref 6.5–8.1)

## 2024-04-29 LAB — LACTATE DEHYDROGENASE: LDH: 136 U/L (ref 98–192)

## 2024-04-29 LAB — VITAMIN B12: Vitamin B-12: 336 pg/mL (ref 180–914)

## 2024-04-29 NOTE — Assessment & Plan Note (Addendum)
#   Essential thrombocytosis- JAK-2 POSITIVE. US - mild splenomegaly.  Currently on hydrea  [started Jan 2023]. On asprin 81 mg/day.   # as Hb is still in 9- recommend CONTINUE hydrea  500 mg/day; but NOT TAKE on SAT & SUNDAY.  SEP 2023 EGD/colonoscopies.  Low clinical concern for transformation-monitor closely.  No evidence of iron deficiency.  Q41m- # DISPOSITION:  # follow up in 4 months MD;  labs-cbc/cmp; -/LDH--Dr.B

## 2024-04-29 NOTE — Patient Instructions (Signed)
# #   CONTINUE hydrea  500 mg/day; but NOT TAKE on SAT & SUNDAY.

## 2024-04-29 NOTE — Progress Notes (Signed)
 [facility] CONSULT NOTE  Patient Care Team: Lise Almarie Ada, PA (Inactive) as PCP - General (Physician Assistant) Rennie Cindy SAUNDERS, MD as Consulting Physician (Oncology)  CHIEF COMPLAINTS/PURPOSE OF CONSULTATION: Essential thrombocytosis.  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   # Hx of Left LE DVT [in 1994]-    Essential thrombocythemia (HCC)  09/22/2021 Initial Diagnosis   Essential thrombocythemia (HCC)     HISTORY OF PRESENTING ILLNESS: Ambulating independently.  Accompanied with  interpreter.  Virginia Esparza 68 y.o.  female pleasant patient with history of JAK-2 positive ET on hydrea  and asprin is here for follow-up.  Denies any nausea vomiting diarrhea.  Negative for chills.  No strokes.   Review of Systems  Constitutional:  Negative for chills, diaphoresis, fever, malaise/fatigue and weight loss.  HENT:  Negative for nosebleeds and sore throat.   Eyes:  Negative for double vision.  Respiratory:  Negative for cough, hemoptysis, sputum production, shortness of breath and wheezing.   Cardiovascular:  Negative for chest pain, palpitations, orthopnea and leg swelling.  Gastrointestinal:  Negative for abdominal pain, blood in stool, constipation, diarrhea, heartburn, melena, nausea and vomiting.  Genitourinary:  Negative for dysuria, frequency and urgency.  Musculoskeletal:  Positive for joint pain. Negative for back pain.  Skin: Negative.  Negative for itching and rash.  Neurological:  Negative for dizziness, tingling, focal weakness, weakness and headaches.  Endo/Heme/Allergies:  Does not bruise/bleed easily.  Psychiatric/Behavioral:  Negative for depression. The patient is not nervous/anxious and does not have insomnia.       MEDICAL HISTORY:  Past Medical History:  Diagnosis Date   Anemia    Diabetes mellitus without complication (HCC)    prediabetic   Gastric erosion    Gastric erythema    Hyperlipemia    Hypertension    Thrombocytopenia (HCC)    Wears dentures    full upper    SURGICAL HISTORY: Past Surgical History:  Procedure Laterality Date   BIOPSY N/A 07/12/2022   Procedure: BIOPSY;  Surgeon: Unk Corinn Skiff, MD;  Location: [facility];  Service: Endoscopy;  Laterality: N/A;   CESAREAN SECTION  1981   CESAREAN SECTION  1995   COLONOSCOPY WITH PROPOFOL  N/A 07/12/2022   Procedure: COLONOSCOPY WITH PROPOFOL ;  Surgeon: Unk Corinn Skiff, MD;  Location: [facility];  Service: Endoscopy;  Laterality: N/A;   ESOPHAGOGASTRODUODENOSCOPY (EGD) WITH PROPOFOL  N/A 07/12/2022   Procedure: ESOPHAGOGASTRODUODENOSCOPY (EGD) WITH PROPOFOL ;  Surgeon: Unk Corinn Skiff, MD;  Location: [facility];  Service: Endoscopy;  Laterality: N/A;   TOE SURGERY      SOCIAL HISTORY: Social History   Socioeconomic History   Marital status: Married    Spouse name: Not on file   Number of children: Not on file   Years of education: Not on file   Highest education level: Not on file  Occupational History   Not on file  Tobacco Use   Smoking status: Never   Smokeless tobacco: Never  Vaping Use   Vaping status: Never Used  Substance and Sexual Activity   Alcohol use: Never   Drug use: Never   Sexual activity: Yes  Other Topics Concern   Not on file  Social History 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  Social Drivers of Corporate investment banker Strain: Not on file  Food Insecurity: Not on file  Transportation Needs: Not on file  Physical Activity: Not on file  Stress: Not on file  Social Connections: Not on file  Intimate Partner Violence: Not on file    FAMILY HISTORY: Family History  Problem  Relation Age of Onset   Diabetes Mother     ALLERGIES:  has no known allergies.  MEDICATIONS:  Current Outpatient Medications  Medication Sig Dispense Refill   aspirin 81 MG EC tablet Take 81 mg by mouth daily.     atorvastatin (LIPITOR) 40 MG tablet Take 40 mg by mouth daily as needed.     chlorthalidone (HYGROTON) 25 MG tablet Take 25 mg by mouth daily.     hydroxyurea  (HYDREA ) 500 MG capsule Take 500 mg by mouth daily.  Take 1 pill daily Mon- Fri; Hold medication every Sat and Sun. May take with food to minimize GI side effects.     lisinopril (ZESTRIL) 40 MG tablet Take 40 mg by mouth daily.     metFORMIN (GLUCOPHAGE) 500 MG tablet Take 500 mg by mouth 2 (two) times daily.     No current facility-administered medications for this visit.     PHYSICAL EXAMINATION:   Vitals:   04/29/24 0958  BP: 115/71  Pulse: 74  Resp: 16  Temp: (!) 97.1 F (36.2 C)  SpO2: 99%    Filed Weights   04/29/24 0958  Weight: 128 lb (58.1 kg)     Physical Exam Vitals and nursing note reviewed.  HENT:     Head: Normocephalic and atraumatic.     Mouth/Throat:     Pharynx: Oropharynx is clear.  Eyes:     Extraocular Movements: Extraocular movements intact.     Pupils: Pupils are equal, round, and reactive to light.  Cardiovascular:     Rate and Rhythm: Normal rate and regular rhythm.  Pulmonary:     Comments: Decreased breath sounds bilaterally.  Abdominal:     Palpations: Abdomen is soft.  Musculoskeletal:        General: Normal range of motion.     Cervical back: Normal range of motion.  Skin:    General: Skin is warm.  Neurological:     General: No focal deficit present.     Mental Status: She is alert and oriented to person, place, and time.  Psychiatric:        Behavior: Behavior normal.        Judgment: Judgment normal.      LABORATORY DATA:  I have reviewed the data as listed Lab Results  Component Value Date   WBC 4.1 04/29/2024   HGB 9.6 (L) 04/29/2024   HCT  30.9 (L) 04/29/2024   MCV 100.3 (H) 04/29/2024   PLT 344 04/29/2024   Recent Labs    08/28/23 0931 02/25/24 0901 04/29/24 0949  NA 138 139 137  K 3.7 3.8 3.7  CL 102 103 104  CO2 29 28 27   GLUCOSE 99 104* 99  BUN 21 19 19   CREATININE 0.96 1.04* 0.94  CALCIUM 9.6 8.6* 9.0  GFRNONAA >60 58* >60  PROT 7.1 6.6 7.9  ALBUMIN 3.9 2.7* 3.4*  AST 22 45* 22  ALT 23 40 17  ALKPHOS 44 87 83  BILITOT 0.7 0.5 0.6     No results found.  ASSESSMENT & PLAN:   Essential thrombocythemia (HCC) # Essential thrombocytosis- JAK-2 POSITIVE. US - mild splenomegaly.  Currently on  hydrea  [started Jan 2023]. On asprin 81 mg/day.   # as Hb is still in 9- recommend CONTINUE hydrea  500 mg/day; but NOT TAKE on SAT & SUNDAY.  SEP 2023 EGD/colonoscopies.  Low clinical concern for transformation-monitor closely.  No evidence of iron deficiency.  Q5m- # DISPOSITION:  # follow up in 4 months MD;  labs-cbc/cmp; -/LDH--Dr.B          Cindy JONELLE Joe, MD 04/29/2024 10:55 AM

## 2024-04-29 NOTE — Progress Notes (Signed)
 Fatigue: NO Bruising: NO Nose bleeds: NO Blood in your stool/heavy periods: NO Abdominal pain: NO Burning pain in the feet/hands/arms/legs/ears or face: NO

## 2024-05-04 NOTE — Anesthesia Preprocedure Evaluation (Signed)
 Anesthesia Evaluation  Patient identified by MRN, date of birth, ID band Patient awake    Reviewed: Allergy & Precautions, H&P , NPO status , Patient's Chart, lab work & pertinent test results  Airway Mallampati: IV  TM Distance: <3 FB Neck ROM: Full    Dental no notable dental hx. (+) Poor Dentition   Pulmonary neg pulmonary ROS   Pulmonary exam normal breath sounds clear to auscultation       Cardiovascular hypertension, negative cardio ROS Normal cardiovascular exam Rhythm:Regular Rate:Normal     Neuro/Psych negative neurological ROS  negative psych ROS   GI/Hepatic negative GI ROS, Neg liver ROS,,,  Endo/Other  negative endocrine ROSdiabetes    Renal/GU negative Renal ROS  negative genitourinary   Musculoskeletal negative musculoskeletal ROS (+)    Abdominal   Peds negative pediatric ROS (+)  Hematology negative hematology ROS (+)   Anesthesia Other Findings Diabetes mellitus without complication (HCC) Hypertension Hyperlipemia Anemia Thrombocytopenia (HCC)  Wears dentures Gastric erythema Gastric erosion  Vastly different BP between right and left arms, patient says she was told she would have to live with it Son translating per patient request  Reproductive/Obstetrics negative OB ROS                              Anesthesia Physical Anesthesia Plan  ASA: 3  Anesthesia Plan: MAC   Post-op Pain Management:    Induction: Intravenous  PONV Risk Score and Plan:   Airway Management Planned: Natural Airway and Nasal Cannula  Additional Equipment:   Intra-op Plan:   Post-operative Plan:   Informed Consent: I have reviewed the patients History and Physical, chart, labs and discussed the procedure including the risks, benefits and alternatives for the proposed anesthesia with the patient or authorized representative who has indicated his/her understanding and acceptance.      Dental Advisory Given  Plan Discussed with: Anesthesiologist, CRNA and Surgeon  Anesthesia Plan Comments: (Patient consented for risks of anesthesia including but not limited to:  - adverse reactions to medications - damage to eyes, teeth, lips or other oral mucosa - nerve damage due to positioning  - sore throat or hoarseness - Damage to heart, brain, nerves, lungs, other parts of body or loss of life  Patient voiced understanding and assent.)        Anesthesia Quick Evaluation

## 2024-05-04 NOTE — Discharge Instructions (Signed)
 El cuidado despu?s de una operaci?n de cataratas ?Cataract Surgery, Care After (Spanish) ? ?Esta hoja le da informaci?n sobre c?mo cuidarse despu?s de su cirug?a. Su oftalm?logo puede darle instrucciones m?s espec?ficas tambi?n. Si tiene problemas o preguntas, comun?quese con su doctor en [facility], [phone]. ? ??Qu? puedo esperar despu?s de la cirug?a? ?Es normal tener: ?Picaz?n ?Sensaci?n de tener un cuerpo extra?o (se siente como un grano de Investment banker, corporate ojo) ?Secreci?n acuosa (lagrimeo excesivo) ?Sensibilidad a la luz y al tacto ?Moretones en el ojo o a su alrededor ?Visi?n ligeramente borrosa ? ?Siga estas instrucciones en casa: ?No se toque ni se frote los ojos. ?Es posible que le digan que use una pantalla protectora o lentes de sol para proteger sus ojos. ?No se ponga un lente de contacto en el ojo operado hasta que su doctor lo apruebe. ?Mantenga los p?rpados y la cara limpios y secos. ?No deje que el agua le caiga directamente en la cara mientras se duche. ?Evite el jab?n y champ? en los ojos. ?No se maquille los ojos por [employer]. ? ?Rev?sese su ojo todos los d?as para ver si hay  signos de infecci?n. Est? atento(a): ?Enrojecimiento, hinchaz?n o dolor. ?L?quido, sangre o pus. ?Empeoramiento de la visi?n. ?Aumento de la sensibilidad a la luz o al tacto. ? ?Actividad: ?Physicist, medical d?a, evite agacharse y leer. Puede volver a leer y a agacharse al d?a siguiente. ?No maneje ni use maquinaria pesada durante al menos 24 horas. ?Evite las actividades vigorosas durante una semana. Est? bien Soil scientist, usar la cinta de correr, usar la bicicleta est?tica y [employer]. ?No levante objetos pesados (m?s de 20 libras) por una semana. ?No realice trabajos de jardiner?a ni tareas dom?sticas sucias en la casa (como limpiar los pisos, los ba?os, pasar la aspiradora, etc.) durante una semana. ?No nade ni use un jacuzzi durante 2 semanas.  ?Pregunte a su doctor cu?ndo  usted puede volver a trabajar. ? ?Instrucciones generales: ?Tome o aplique los medicamentos recetados y de venta libre seg?n le indique su doctor, incluyendo las gotas para los ojos y las pomadas. ?Contin?e tomando los medicamentos que fueron suspendidos antes de la cirug?a, a menos que su doctor le indique lo contrario. ?Vaya a todas sus citas de seguimiento que ya fueron programadas. ? ? ?P?ngase en contacto con un proveedor de atenci?n m?dica si: ?Le aparecen m?s moretones alrededor del ojo. ?Tiene dolor que no se Systems analyst. ?Tiene fiebre. ?Le sale l?quido, pus o sangre del ojo o de la incisi?n. ?Su sensibilidad a la Actor. ?Tiene manchas (moscas volantes) o destellos de luz en la vista. ?Tiene n?useas o v?mitos. ? ?Vaya al Departamento de Emergencias m?s cercana o llame al 911 si: ?Pierde la vista de forma repentina. ?Tiene un dolor de ojo que es intenso y que se est? empeorando. ?

## 2024-05-05 ENCOUNTER — Ambulatory Visit
Admission: RE | Admit: 2024-05-05 | Discharge: 2024-05-05 | Disposition: A | Discharge status: Home / Self Care | Attending: Ophthalmology | Admitting: Ophthalmology

## 2024-05-05 ENCOUNTER — Encounter: Payer: Self-pay | Admitting: Ophthalmology

## 2024-05-05 ENCOUNTER — Encounter
Admission: RE | Disposition: A | Discharge status: Home / Self Care | Payer: Self-pay | Source: Home / Self Care | Attending: Ophthalmology

## 2024-05-05 ENCOUNTER — Other Ambulatory Visit: Payer: Self-pay

## 2024-05-05 ENCOUNTER — Ambulatory Visit: Payer: Self-pay | Admitting: Anesthesiology

## 2024-05-05 DIAGNOSIS — E1136 Type 2 diabetes mellitus with diabetic cataract: Secondary | ICD-10-CM | POA: Insufficient documentation

## 2024-05-05 DIAGNOSIS — Z7984 Long term (current) use of oral hypoglycemic drugs: Secondary | ICD-10-CM | POA: Diagnosis not present

## 2024-05-05 DIAGNOSIS — I10 Essential (primary) hypertension: Secondary | ICD-10-CM | POA: Insufficient documentation

## 2024-05-05 DIAGNOSIS — H2512 Age-related nuclear cataract, left eye: Secondary | ICD-10-CM | POA: Diagnosis present

## 2024-05-05 HISTORY — PX: CATARACT EXTRACTION W/PHACO: SHX586

## 2024-05-05 LAB — GLUCOSE, CAPILLARY: Glucose-Capillary: 89 mg/dL (ref 70–99)

## 2024-05-05 SURGERY — PHACOEMULSIFICATION, CATARACT, WITH IOL INSERTION
Anesthesia: Monitor Anesthesia Care | Site: Eye | Laterality: Left

## 2024-05-05 MED ORDER — TETRACAINE HCL 0.5 % OP SOLN
OPHTHALMIC | Status: AC
  Filled 2024-05-05: qty 4

## 2024-05-05 MED ORDER — TETRACAINE HCL 0.5 % OP SOLN
1.0000 [drp] | OPHTHALMIC | Status: DC | PRN
  Administered 2024-05-05 (×3): 1 [drp] via OPHTHALMIC

## 2024-05-05 MED ORDER — SIGHTPATH DOSE#1 BSS IO SOLN
INTRAOCULAR | Status: DC | PRN
  Administered 2024-05-05: 15 mL via INTRAOCULAR

## 2024-05-05 MED ORDER — SIGHTPATH DOSE#1 NA CHONDROIT SULF-NA HYALURON 40-17 MG/ML IO SOLN
INTRAOCULAR | Status: DC | PRN
  Administered 2024-05-05: 1 mL via INTRAOCULAR

## 2024-05-05 MED ORDER — MOXIFLOXACIN HCL 0.5 % OP SOLN
OPHTHALMIC | Status: DC | PRN
  Administered 2024-05-05: .2 mL via OPHTHALMIC

## 2024-05-05 MED ORDER — MIDAZOLAM HCL 2 MG/2ML IJ SOLN
INTRAMUSCULAR | Status: DC | PRN
  Administered 2024-05-05: 1 mg via INTRAVENOUS

## 2024-05-05 MED ORDER — SIGHTPATH DOSE#1 BSS IO SOLN
INTRAOCULAR | Status: DC | PRN
  Administered 2024-05-05: 41 mL via OPHTHALMIC

## 2024-05-05 MED ORDER — FENTANYL CITRATE (PF) 100 MCG/2ML IJ SOLN
INTRAMUSCULAR | Status: DC | PRN
  Administered 2024-05-05: 50 ug via INTRAVENOUS

## 2024-05-05 MED ORDER — FENTANYL CITRATE (PF) 100 MCG/2ML IJ SOLN
INTRAMUSCULAR | Status: AC
Start: 2024-05-05 — End: 2024-05-05
  Filled 2024-05-05: qty 2

## 2024-05-05 MED ORDER — LACTATED RINGERS IV SOLN: INTRAVENOUS | Status: DC

## 2024-05-05 MED ORDER — MIDAZOLAM HCL 2 MG/2ML IJ SOLN
INTRAMUSCULAR | Status: AC
Start: 2024-05-05 — End: 2024-05-05
  Filled 2024-05-05: qty 2

## 2024-05-05 MED ORDER — BRIMONIDINE TARTRATE-TIMOLOL 0.2-0.5 % OP SOLN
OPHTHALMIC | Status: DC | PRN
Start: 2024-05-05 — End: 2024-05-05
  Administered 2024-05-05: 1 [drp] via OPHTHALMIC

## 2024-05-05 MED ORDER — ARMC OPHTHALMIC DILATING DROPS
OPHTHALMIC | Status: AC
  Filled 2024-05-05: qty 0.5

## 2024-05-05 MED ORDER — LIDOCAINE HCL (PF) 2 % IJ SOLN
INTRAOCULAR | Status: DC | PRN
  Administered 2024-05-05: 2 mL

## 2024-05-05 MED ORDER — ARMC OPHTHALMIC DILATING DROPS
1.0000 | OPHTHALMIC | Status: DC | PRN
  Administered 2024-05-05 (×3): 1 via OPHTHALMIC

## 2024-05-05 SURGICAL SUPPLY — 10 items
CATARACT SUITE SIGHTPATH (MISCELLANEOUS) ×1 IMPLANT
CYSTOTOME ANGL RVRS SHRT 25G (CUTTER) ×1 IMPLANT
FEE CATARACT SUITE SIGHTPATH (MISCELLANEOUS) ×1 IMPLANT
GLOVE BIOGEL PI IND STRL 8 (GLOVE) ×1 IMPLANT
GLOVE SURG LX STRL 8.0 MICRO (GLOVE) ×1 IMPLANT
GLOVE SURG SYN 6.5 PF PI BL (GLOVE) ×1 IMPLANT
LENS IOL TECNIS EYHANCE 23.0 (Intraocular Lens) IMPLANT
NDL FILTER BLUNT 18X1 1/2 (NEEDLE) ×1 IMPLANT
NEEDLE FILTER BLUNT 18X1 1/2 (NEEDLE) ×1 IMPLANT
SYR 3ML LL SCALE MARK (SYRINGE) ×1 IMPLANT

## 2024-05-05 NOTE — Transfer of Care (Signed)
 Immediate Anesthesia Transfer of Care Note  Patient: Virginia Esparza  Procedure(s) Performed: PHACOEMULSIFICATION, CATARACT, WITH IOL INSERTION 5.72 00:34.6 (Left: Eye)  Patient Location: PACU  Anesthesia Type: MAC  Level of Consciousness: awake, alert  and patient cooperative  Airway and Oxygen Therapy: Patient Spontanous Breathing and Patient connected to supplemental oxygen  Post-op Assessment: Post-op Vital signs reviewed, Patient's Cardiovascular Status Stable, Respiratory Function Stable, Patent Airway and No signs of Nausea or vomiting  Post-op Vital Signs: Reviewed and stable  Complications: No notable events documented.

## 2024-05-05 NOTE — H&P (Signed)
 [facility] [place]   Primary Care Physician:  Lise Almarie Ada, PA (Inactive) Ophthalmologist: Dr. Ollie  Pre-Procedure History & Physical: HPI:  Virginia Esparza is a 68 y.o. female here for cataract surgery.   Past Medical History:  Diagnosis Date   Anemia    Diabetes mellitus without complication (HCC)    prediabetic   Gastric erosion    Gastric erythema    Hyperlipemia    Hypertension    Thrombocytopenia (HCC)    Wears dentures    full upper    Past Surgical History:  Procedure Laterality Date   BIOPSY N/A 07/12/2022   Procedure: BIOPSY;  Surgeon: Unk Corinn Skiff, MD;  Location: [facility];  Service: Endoscopy;  Laterality: N/A;   CESAREAN SECTION  1981   CESAREAN SECTION  1995   COLONOSCOPY WITH PROPOFOL  N/A 07/12/2022   Procedure: COLONOSCOPY WITH PROPOFOL ;  Surgeon: Unk Corinn Skiff, MD;  Location: [facility];  Service: Endoscopy;  Laterality: N/A;   ESOPHAGOGASTRODUODENOSCOPY (EGD) WITH PROPOFOL  N/A 07/12/2022   Procedure: ESOPHAGOGASTRODUODENOSCOPY (EGD) WITH PROPOFOL ;  Surgeon: Unk Corinn Skiff, MD;  Location: [facility];  Service: Endoscopy;  Laterality: N/A;   TOE SURGERY      Prior to Admission medications   Medication Sig Start Date End Date Taking? Authorizing Provider  aspirin 81 MG EC tablet Take 81 mg by mouth daily.   Yes [provider]  atorvastatin (LIPITOR) 40 MG tablet Take 40 mg by mouth daily as needed. 09/12/21  Yes [provider]  chlorthalidone (HYGROTON) 25 MG tablet Take 25 mg by mouth daily. 08/24/21  Yes [provider]  hydroxyurea  (HYDREA ) 500 MG capsule Take 500 mg by mouth daily.  Take 1 pill daily Mon- Fri; Hold medication every Sat and Sun. May take with food to minimize GI side effects.   Yes [provider]  lisinopril (ZESTRIL) 40 MG tablet Take 40 mg by mouth daily. 08/24/21  Yes [provider]  metFORMIN (GLUCOPHAGE) 500 MG tablet Take  500 mg by mouth 2 (two) times daily. 08/07/21  Yes [provider]    Allergies as of 04/21/2024   (No Known Allergies)    Family History  Problem Relation Age of Onset   Diabetes Mother     Social History   Socioeconomic History   Marital status: Married    Spouse name: Not on file   Number of children: Not on file   Years of education: Not on file   Highest education level: Not on file  Occupational History   Not on file  Tobacco Use   Smoking status: Never   Smokeless tobacco: Never  Vaping Use   Vaping status: Never Used  Substance and Sexual Activity   Alcohol use: Never   Drug use: Never   Sexual activity: Yes  Other Topics Concern   Not on file  Social History 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 Lives in [place]- with husband/son.    Social Drivers of Corporate investment banker Strain: Not on file  Food Insecurity: Not on file  Transportation Needs: Not on file  Physical Activity: Not on file  Stress: Not on file  Social Connections: Not on file  Intimate Partner Violence: Not on file    Review of Systems: See HPI, otherwise negative ROS  Physical Exam: BP (!) 124/59   Pulse 76   Temp 97.8 F (36.6 C) (Temporal)   Ht 4' 10 (1.473 m)   Wt  58.1 kg   SpO2 96%   BMI 26.75 kg/m  General:   Alert, cooperative. Head:  Normocephalic and atraumatic. Respiratory:  Normal work of breathing. Cardiovascular:  NAD  Impression/Plan: Virginia Esparza is here for cataract surgery.  Risks, benefits, limitations, and alternatives regarding cataract surgery have been reviewed with the patient.  Questions have been answered.  All parties agreeable.   Elsie Carmine, MD  05/05/2024, 7:46 AM

## 2024-05-05 NOTE — Op Note (Signed)
 PREOPERATIVE DIAGNOSIS:  Nuclear sclerotic cataract of the left eye.   POSTOPERATIVE DIAGNOSIS:  Nuclear sclerotic cataract of the left eye.   OPERATIVE PROCEDURE:ORPROCALL@   SURGEON:  Elsie Carmine, MD.   ANESTHESIA:  Anesthesiologist: Ola Donny BROCKS, MD CRNA: Jahoo, Sonia, CRNA  1.      Managed anesthesia care. 2.     0.64ml of Shugarcaine was instilled following the paracentesis   COMPLICATIONS:  None.   TECHNIQUE:   Stop and chop   DESCRIPTION OF PROCEDURE:  The patient was examined and consented in the preoperative holding area where the aforementioned topical anesthesia was applied to the left eye and then brought back to the Operating Room where the left eye was prepped and draped in the usual sterile ophthalmic fashion and a lid speculum was placed. A paracentesis was created with the side port blade and the anterior chamber was filled with viscoelastic. A near clear corneal incision was performed with the steel keratome. A continuous curvilinear capsulorrhexis was performed with a cystotome followed by the capsulorrhexis forceps. Hydrodissection and hydrodelineation were carried out with BSS on a blunt cannula. The lens was removed in a stop and chop  technique and the remaining cortical material was removed with the irrigation-aspiration handpiece. The capsular bag was inflated with viscoelastic and the Technis ZCB00 lens was placed in the capsular bag without complication. The remaining viscoelastic was removed from the eye with the irrigation-aspiration handpiece. The wounds were hydrated. The anterior chamber was flushed with BSS and the eye was inflated to physiologic pressure. 0.30ml Vigamox  was placed in the anterior chamber. The wounds were found to be water  tight. The eye was dressed with Combigan . The patient was given protective glasses to wear throughout the day and a shield with which to sleep tonight. The patient was also given drops with which to begin a drop regimen  today and will follow-up with me in one day. Implant Name Type Inv. Item Serial No. Manufacturer Lot No. LRB No. Used Action  LENS IOL TECNIS EYHANCE 23.0 - [id number] Intraocular Lens LENS IOL TECNIS EYHANCE 23.0 [id number] SIGHTPATH  Left 1 Implanted    Procedure(s): PHACOEMULSIFICATION, CATARACT, WITH IOL INSERTION 5.72 00:34.6 (Left)  Electronically signed: Elsie Carmine 05/05/2024 8:06 AM

## 2024-05-05 NOTE — Anesthesia Postprocedure Evaluation (Signed)
 Anesthesia Post Note  Patient: Virginia Esparza  Procedure(s) Performed: PHACOEMULSIFICATION, CATARACT, WITH IOL INSERTION 5.72 00:34.6 (Left: Eye)  Patient location during evaluation: PACU Anesthesia Type: MAC Level of consciousness: awake and alert Pain management: pain level controlled Vital Signs Assessment: post-procedure vital signs reviewed and stable Respiratory status: spontaneous breathing, nonlabored ventilation, respiratory function stable and patient connected to nasal cannula oxygen Cardiovascular status: stable and blood pressure returned to baseline Postop Assessment: no apparent nausea or vomiting Anesthetic complications: no   No notable events documented.   Last Vitals:  Vitals:   05/05/24 0806 05/05/24 0812  BP: (!) 100/53 108/63  Pulse: 73   Resp: 18   Temp: 36.7 C   SpO2: 99%     Last Pain:  Vitals:   05/05/24 0812  TempSrc:   PainSc: 0-No pain                 Donny JAYSON Mu

## 2024-05-11 ENCOUNTER — Encounter: Payer: Self-pay | Admitting: Ophthalmology

## 2024-05-11 NOTE — Anesthesia Preprocedure Evaluation (Addendum)
 Anesthesia Evaluation  Patient identified by MRN, date of birth, ID band Patient awake    Reviewed: Allergy & Precautions, H&P , NPO status , Patient's Chart, lab work & pertinent test results  Airway Mallampati: IV  TM Distance: <3 FB Neck ROM: Full    Dental no notable dental hx. (+) Poor Dentition   Pulmonary neg pulmonary ROS   Pulmonary exam normal breath sounds clear to auscultation       Cardiovascular hypertension, + Peripheral Vascular Disease  negative cardio ROS Normal cardiovascular exam Rhythm:Regular Rate:Normal     Neuro/Psych negative neurological ROS  negative psych ROS   GI/Hepatic negative GI ROS, Neg liver ROS, PUD,,,  Endo/Other  negative endocrine ROSdiabetes    Renal/GU negative Renal ROS  negative genitourinary   Musculoskeletal negative musculoskeletal ROS (+)    Abdominal   Peds negative pediatric ROS (+)  Hematology negative hematology ROS (+) Blood dyscrasia, anemia   Anesthesia Other Findings Previous cataract surgery 05-05-24 Dr. Ola    Diabetes mellitus without complication (HCC) Hypertension Hyperlipemia Anemia  Wears dentures Gastric erythema  Gastric erosion Hx of splenomegaly  History of thrombocytosis Iron deficiency anemia     Reproductive/Obstetrics negative OB ROS                              Anesthesia Physical Anesthesia Plan  ASA: 3  Anesthesia Plan: MAC   Post-op Pain Management:    Induction: Intravenous  PONV Risk Score and Plan:   Airway Management Planned: Natural Airway and Nasal Cannula  Additional Equipment:   Intra-op Plan:   Post-operative Plan:   Informed Consent: I have reviewed the patients History and Physical, chart, labs and discussed the procedure including the risks, benefits and alternatives for the proposed anesthesia with the patient or authorized representative who has indicated his/her  understanding and acceptance.     Dental Advisory Given  Plan Discussed with: Anesthesiologist, CRNA and Surgeon  Anesthesia Plan Comments: (Patient consented for risks of anesthesia including but not limited to:  - adverse reactions to medications - damage to eyes, teeth, lips or other oral mucosa - nerve damage due to positioning  - sore throat or hoarseness - Damage to heart, brain, nerves, lungs, other parts of body or loss of life  Patient voiced understanding and assent.)         Anesthesia Quick Evaluation

## 2024-05-14 NOTE — Discharge Instructions (Signed)
 El cuidado despu?s de una operaci?n de cataratas ?Cataract Surgery, Care After (Spanish) ? ?Esta hoja le da informaci?n sobre c?mo cuidarse despu?s de su cirug?a. Su oftalm?logo puede darle instrucciones m?s espec?ficas tambi?n. Si tiene problemas o preguntas, comun?quese con su doctor en [facility], [phone]. ? ??Qu? puedo esperar despu?s de la cirug?a? ?Es normal tener: ?Picaz?n ?Sensaci?n de tener un cuerpo extra?o (se siente como un grano de Investment banker, corporate ojo) ?Secreci?n acuosa (lagrimeo excesivo) ?Sensibilidad a la luz y al tacto ?Moretones en el ojo o a su alrededor ?Visi?n ligeramente borrosa ? ?Siga estas instrucciones en casa: ?No se toque ni se frote los ojos. ?Es posible que le digan que use una pantalla protectora o lentes de sol para proteger sus ojos. ?No se ponga un lente de contacto en el ojo operado hasta que su doctor lo apruebe. ?Mantenga los p?rpados y la cara limpios y secos. ?No deje que el agua le caiga directamente en la cara mientras se duche. ?Evite el jab?n y champ? en los ojos. ?No se maquille los ojos por [employer]. ? ?Rev?sese su ojo todos los d?as para ver si hay  signos de infecci?n. Est? atento(a): ?Enrojecimiento, hinchaz?n o dolor. ?L?quido, sangre o pus. ?Empeoramiento de la visi?n. ?Aumento de la sensibilidad a la luz o al tacto. ? ?Actividad: ?Physicist, medical d?a, evite agacharse y leer. Puede volver a leer y a agacharse al d?a siguiente. ?No maneje ni use maquinaria pesada durante al menos 24 horas. ?Evite las actividades vigorosas durante una semana. Est? bien Soil scientist, usar la cinta de correr, usar la bicicleta est?tica y [employer]. ?No levante objetos pesados (m?s de 20 libras) por una semana. ?No realice trabajos de jardiner?a ni tareas dom?sticas sucias en la casa (como limpiar los pisos, los ba?os, pasar la aspiradora, etc.) durante una semana. ?No nade ni use un jacuzzi durante 2 semanas.  ?Pregunte a su doctor cu?ndo  usted puede volver a trabajar. ? ?Instrucciones generales: ?Tome o aplique los medicamentos recetados y de venta libre seg?n le indique su doctor, incluyendo las gotas para los ojos y las pomadas. ?Contin?e tomando los medicamentos que fueron suspendidos antes de la cirug?a, a menos que su doctor le indique lo contrario. ?Vaya a todas sus citas de seguimiento que ya fueron programadas. ? ? ?P?ngase en contacto con un proveedor de atenci?n m?dica si: ?Le aparecen m?s moretones alrededor del ojo. ?Tiene dolor que no se Systems analyst. ?Tiene fiebre. ?Le sale l?quido, pus o sangre del ojo o de la incisi?n. ?Su sensibilidad a la Actor. ?Tiene manchas (moscas volantes) o destellos de luz en la vista. ?Tiene n?useas o v?mitos. ? ?Vaya al Departamento de Emergencias m?s cercana o llame al 911 si: ?Pierde la vista de forma repentina. ?Tiene un dolor de ojo que es intenso y que se est? empeorando. ?

## 2024-05-19 ENCOUNTER — Ambulatory Visit
Admission: RE | Admit: 2024-05-19 | Discharge: 2024-05-19 | Disposition: A | Discharge status: Home / Self Care | Attending: Ophthalmology | Admitting: Ophthalmology

## 2024-05-19 ENCOUNTER — Encounter: Payer: Self-pay | Admitting: Ophthalmology

## 2024-05-19 ENCOUNTER — Ambulatory Visit: Payer: Self-pay | Admitting: Anesthesiology

## 2024-05-19 ENCOUNTER — Encounter
Admission: RE | Disposition: A | Discharge status: Home / Self Care | Payer: Self-pay | Source: Home / Self Care | Attending: Ophthalmology

## 2024-05-19 ENCOUNTER — Other Ambulatory Visit: Payer: Self-pay

## 2024-05-19 DIAGNOSIS — Z7984 Long term (current) use of oral hypoglycemic drugs: Secondary | ICD-10-CM | POA: Diagnosis not present

## 2024-05-19 DIAGNOSIS — E785 Hyperlipidemia, unspecified: Secondary | ICD-10-CM | POA: Diagnosis not present

## 2024-05-19 DIAGNOSIS — H2511 Age-related nuclear cataract, right eye: Secondary | ICD-10-CM | POA: Insufficient documentation

## 2024-05-19 DIAGNOSIS — E1136 Type 2 diabetes mellitus with diabetic cataract: Secondary | ICD-10-CM | POA: Diagnosis not present

## 2024-05-19 DIAGNOSIS — I10 Essential (primary) hypertension: Secondary | ICD-10-CM | POA: Insufficient documentation

## 2024-05-19 HISTORY — DX: Iron deficiency anemia, unspecified: D50.9

## 2024-05-19 HISTORY — DX: Personal history of other specified conditions: Z87.898

## 2024-05-19 HISTORY — DX: Personal history of diseases of the blood and blood-forming organs and certain disorders involving the immune mechanism: Z86.2

## 2024-05-19 HISTORY — PX: CATARACT EXTRACTION W/PHACO: SHX586

## 2024-05-19 LAB — GLUCOSE, CAPILLARY: Glucose-Capillary: 87 mg/dL (ref 70–99)

## 2024-05-19 SURGERY — PHACOEMULSIFICATION, CATARACT, WITH IOL INSERTION
Anesthesia: Monitor Anesthesia Care | Site: Eye | Laterality: Right

## 2024-05-19 MED ORDER — ARMC OPHTHALMIC DILATING DROPS
1.0000 | OPHTHALMIC | Status: DC | PRN
  Administered 2024-05-19 (×3): 1 via OPHTHALMIC

## 2024-05-19 MED ORDER — TETRACAINE HCL 0.5 % OP SOLN
OPHTHALMIC | Status: AC
  Filled 2024-05-19: qty 4

## 2024-05-19 MED ORDER — FENTANYL CITRATE (PF) 100 MCG/2ML IJ SOLN
INTRAMUSCULAR | Status: AC
Start: 2024-05-19 — End: 2024-05-19
  Filled 2024-05-19: qty 2

## 2024-05-19 MED ORDER — ARMC OPHTHALMIC DILATING DROPS
OPHTHALMIC | Status: AC
  Filled 2024-05-19: qty 0.5

## 2024-05-19 MED ORDER — LIDOCAINE HCL (PF) 2 % IJ SOLN
INTRAOCULAR | Status: DC | PRN
  Administered 2024-05-19: 2 mL via INTRAMUSCULAR

## 2024-05-19 MED ORDER — EPINEPHRINE PF 1 MG/ML IJ SOLN
INTRAMUSCULAR | Status: DC | PRN
  Administered 2024-05-19: 35 mL via OPHTHALMIC

## 2024-05-19 MED ORDER — BRIMONIDINE TARTRATE-TIMOLOL 0.2-0.5 % OP SOLN
OPHTHALMIC | Status: DC | PRN
  Administered 2024-05-19: 1 [drp] via OPHTHALMIC

## 2024-05-19 MED ORDER — MIDAZOLAM HCL 2 MG/2ML IJ SOLN
INTRAMUSCULAR | Status: AC
  Filled 2024-05-19: qty 2

## 2024-05-19 MED ORDER — FENTANYL CITRATE (PF) 100 MCG/2ML IJ SOLN
INTRAMUSCULAR | Status: DC | PRN
  Administered 2024-05-19: 50 ug via INTRAVENOUS

## 2024-05-19 MED ORDER — MOXIFLOXACIN HCL 0.5 % OP SOLN
OPHTHALMIC | Status: DC | PRN
  Administered 2024-05-19: .2 mL via OPHTHALMIC

## 2024-05-19 MED ORDER — TETRACAINE HCL 0.5 % OP SOLN
1.0000 [drp] | OPHTHALMIC | Status: DC | PRN
  Administered 2024-05-19 (×3): 1 [drp] via OPHTHALMIC

## 2024-05-19 MED ORDER — SIGHTPATH DOSE#1 BSS IO SOLN
INTRAOCULAR | Status: DC | PRN
  Administered 2024-05-19: 15 mL via INTRAOCULAR

## 2024-05-19 MED ORDER — LACTATED RINGERS IV SOLN: INTRAVENOUS | Status: DC

## 2024-05-19 MED ORDER — MIDAZOLAM HCL 2 MG/2ML IJ SOLN
INTRAMUSCULAR | Status: DC | PRN
Start: 2024-05-19 — End: 2024-05-19
  Administered 2024-05-19: 1 mg via INTRAVENOUS

## 2024-05-19 MED ORDER — SIGHTPATH DOSE#1 NA CHONDROIT SULF-NA HYALURON 40-17 MG/ML IO SOLN
INTRAOCULAR | Status: DC | PRN
Start: 2024-05-19 — End: 2024-05-19
  Administered 2024-05-19: 1 mL via INTRAOCULAR

## 2024-05-19 SURGICAL SUPPLY — 10 items
CATARACT SUITE SIGHTPATH (MISCELLANEOUS) ×1 IMPLANT
CYSTOTOME ANGL RVRS SHRT 25G (CUTTER) ×1 IMPLANT
FEE CATARACT SUITE SIGHTPATH (MISCELLANEOUS) ×1 IMPLANT
GLOVE BIOGEL PI IND STRL 8 (GLOVE) ×1 IMPLANT
GLOVE SURG LX STRL 8.0 MICRO (GLOVE) ×1 IMPLANT
GLOVE SURG SYN 6.5 PF PI BL (GLOVE) ×1 IMPLANT
LENS IOL TECNIS EYHANCE 22.5 (Intraocular Lens) IMPLANT
NDL FILTER BLUNT 18X1 1/2 (NEEDLE) ×1 IMPLANT
NEEDLE FILTER BLUNT 18X1 1/2 (NEEDLE) ×1 IMPLANT
SYR 3ML LL SCALE MARK (SYRINGE) ×1 IMPLANT

## 2024-05-19 NOTE — Op Note (Signed)
 PREOPERATIVE DIAGNOSIS:  Nuclear sclerotic cataract of the right eye.   POSTOPERATIVE DIAGNOSIS:  Right Eye Cataract   OPERATIVE PROCEDURE:ORPROCALL@   SURGEON:  Elsie Carmine, MD.   ANESTHESIA:  Anesthesiologist: Ola Donny BROCKS, MD CRNA: Jahoo, Sonia, CRNA  1.      Managed anesthesia care. 2.      0.46ml of Shugarcaine was instilled in the eye following the paracentesis.   COMPLICATIONS:  None.   TECHNIQUE:   Stop and chop   DESCRIPTION OF PROCEDURE:  The patient was examined and consented in the preoperative holding area where the aforementioned topical anesthesia was applied to the right eye and then brought back to the Operating Room where the right eye was prepped and draped in the usual sterile ophthalmic fashion and a lid speculum was placed. A paracentesis was created with the side port blade and the anterior chamber was filled with viscoelastic. A near clear corneal incision was performed with the steel keratome. A continuous curvilinear capsulorrhexis was performed with a cystotome followed by the capsulorrhexis forceps. Hydrodissection and hydrodelineation were carried out with BSS on a blunt cannula. The lens was removed in a stop and chop  technique and the remaining cortical material was removed with the irrigation-aspiration handpiece. The capsular bag was inflated with viscoelastic and the Technis ZCB00  lens was placed in the capsular bag without complication. The remaining viscoelastic was removed from the eye with the irrigation-aspiration handpiece. The wounds were hydrated. The anterior chamber was flushed with BSS and the eye was inflated to physiologic pressure. 0.40ml of Vigamox  was placed in the anterior chamber. The wounds were found to be water  tight. The eye was dressed with Combigan . The patient was given protective glasses to wear throughout the day and a shield with which to sleep tonight. The patient was also given drops with which to begin a drop regimen today  and will follow-up with me in one day. Implant Name Type Inv. Item Serial No. Manufacturer Lot No. LRB No. Used Action  LENS IOL TECNIS EYHANCE 22.5 - [id number] Intraocular Lens LENS IOL TECNIS EYHANCE 22.5 [id number] [employer]  Right 1 Implanted   Procedure(s): PHACOEMULSIFICATION, CATARACT, WITH IOL INSERTION 3.95 00:21.3 (Right)  Electronically signed: Elsie Carmine 05/19/2024 8:32 AM

## 2024-05-19 NOTE — Transfer of Care (Signed)
 Immediate Anesthesia Transfer of Care Note  Patient: Virginia Esparza  Procedure(s) Performed: PHACOEMULSIFICATION, CATARACT, WITH IOL INSERTION 3.95 00:21.3 (Right: Eye)  Patient Location: PACU  Anesthesia Type: MAC  Level of Consciousness: awake, alert  and patient cooperative  Airway and Oxygen Therapy: Patient Spontanous Breathing and Patient connected to supplemental oxygen  Post-op Assessment: Post-op Vital signs reviewed, Patient's Cardiovascular Status Stable, Respiratory Function Stable, Patent Airway and No signs of Nausea or vomiting  Post-op Vital Signs: Reviewed and stable  Complications: No notable events documented.

## 2024-05-19 NOTE — H&P (Signed)
 [facility]   Primary Care Physician:  Lise Almarie Ada, PA (Inactive) Ophthalmologist: Dr. Jaye  Pre-Procedure History & Physical: HPI:  Virginia Esparza is a 68 y.o. female here for cataract surgery.   Past Medical History:  Diagnosis Date   Anemia    Diabetes mellitus without complication (HCC)    prediabetic   Gastric erosion    Gastric erythema    History of thrombocytosis    Hx of splenomegaly    Hyperlipemia    Hypertension    Iron deficiency anemia    Thrombocytopenia (HCC)    Wears dentures    full upper    Past Surgical History:  Procedure Laterality Date   BIOPSY N/A 07/12/2022   Procedure: BIOPSY;  Surgeon: Unk Corinn Skiff, MD;  Location: [facility];  Service: Endoscopy;  Laterality: N/A;   CATARACT EXTRACTION W/PHACO Left 05/05/2024   Procedure: PHACOEMULSIFICATION, CATARACT, WITH IOL INSERTION 5.72 00:34.6;  Surgeon: Jaye Fallow, MD;  Location: [facility];  Service: Ophthalmology;  Laterality: Left;   CESAREAN SECTION  1981   CESAREAN SECTION  1995   COLONOSCOPY WITH PROPOFOL  N/A 07/12/2022   Procedure: COLONOSCOPY WITH PROPOFOL ;  Surgeon: Unk Corinn Skiff, MD;  Location: [facility];  Service: Endoscopy;  Laterality: N/A;   ESOPHAGOGASTRODUODENOSCOPY (EGD) WITH PROPOFOL  N/A 07/12/2022   Procedure: ESOPHAGOGASTRODUODENOSCOPY (EGD) WITH PROPOFOL ;  Surgeon: Unk Corinn Skiff, MD;  Location: [facility];  Service: Endoscopy;  Laterality: N/A;   TOE SURGERY      Prior to Admission medications   Medication Sig Start Date End Date Taking? Authorizing Provider  aspirin 81 MG EC tablet Take 81 mg by mouth daily.   Yes [provider]  atorvastatin (LIPITOR) 40 MG tablet Take 40 mg by mouth daily as needed. 09/12/21  Yes [provider]  chlorthalidone (HYGROTON) 25 MG tablet Take 25 mg by mouth daily. 08/24/21  Yes [provider]  hydroxyurea  (HYDREA ) 500 MG capsule  Take 500 mg by mouth daily.  Take 1 pill daily Mon- Fri; Hold medication every Sat and Sun. May take with food to minimize GI side effects.   Yes [provider]  lisinopril (ZESTRIL) 40 MG tablet Take 40 mg by mouth daily. 08/24/21  Yes [provider]  metFORMIN (GLUCOPHAGE) 500 MG tablet Take 500 mg by mouth 2 (two) times daily. 08/07/21  Yes [provider]    Allergies as of 04/21/2024   (No Known Allergies)    Family History  Problem Relation Age of Onset   Diabetes Mother     Social History   Socioeconomic History   Marital status: Married    Spouse name: Not on file   Number of children: Not on file   Years of education: Not on file   Highest education level: Not on file  Occupational History   Not on file  Tobacco Use   Smoking status: Never   Smokeless tobacco: Never  Vaping Use   Vaping status: Never Used  Substance and Sexual Activity   Alcohol use: Never   Drug use: Never   Sexual activity: Yes  Other Topics Concern   Not on file  Social History 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 Lives in [place]- with husband/son.    Social Drivers of Corporate investment banker Strain: Not on file  Food Insecurity: Not on file  Transportation Needs: Not on file  Physical Activity: Not on file  Stress: Not on file  Social Connections: Not on file  Intimate Partner Violence: Not on file    Review of Systems: See HPI, otherwise negative ROS  Physical Exam: BP (!) 160/61   Pulse 77   Temp 98.4 F (36.9 C) (Temporal)   Resp 17   Ht 4' 10 (1.473 m)   Wt 56.7 kg   SpO2 97%   BMI 26.13 kg/m  General:   Alert, cooperative. Head:  Normocephalic and atraumatic. Respiratory:  Normal work of breathing. Cardiovascular:  NAD  Impression/Plan: Virginia Esparza is here for cataract surgery.  Risks, benefits, limitations, and alternatives regarding cataract surgery have been reviewed with the patient.   Questions have been answered.  All parties agreeable.   Virginia Carmine, MD  05/19/2024, 8:13 AM

## 2024-05-19 NOTE — Anesthesia Postprocedure Evaluation (Signed)
 Anesthesia Post Note  Patient: Virginia Esparza  Procedure(s) Performed: PHACOEMULSIFICATION, CATARACT, WITH IOL INSERTION 3.95 00:21.3 (Right: Eye)  Patient location during evaluation: PACU Anesthesia Type: MAC Level of consciousness: awake and alert Pain management: pain level controlled Vital Signs Assessment: post-procedure vital signs reviewed and stable Respiratory status: spontaneous breathing, nonlabored ventilation, respiratory function stable and patient connected to nasal cannula oxygen Cardiovascular status: stable and blood pressure returned to baseline Postop Assessment: no apparent nausea or vomiting Anesthetic complications: no   No notable events documented.   Last Vitals:  Vitals:   05/19/24 0833 05/19/24 0838  BP: 124/60   Pulse: 70   Resp: 11   Temp: 36.7 C 36.7 C  SpO2: 98%     Last Pain:  Vitals:   05/19/24 0838  TempSrc:   PainSc: 0-No pain                 Donny JAYSON Mu

## 2024-06-16 ENCOUNTER — Other Ambulatory Visit: Payer: Self-pay | Admitting: Internal Medicine

## 2024-08-06 ENCOUNTER — Other Ambulatory Visit: Payer: Self-pay | Admitting: Nurse Practitioner

## 2024-08-06 DIAGNOSIS — Z780 Asymptomatic menopausal state: Secondary | ICD-10-CM

## 2024-08-31 ENCOUNTER — Inpatient Hospital Stay: Admitting: Internal Medicine

## 2024-08-31 ENCOUNTER — Encounter: Payer: Self-pay | Admitting: Internal Medicine

## 2024-08-31 ENCOUNTER — Inpatient Hospital Stay

## 2024-09-01 ENCOUNTER — Other Ambulatory Visit: Payer: Self-pay | Admitting: Nurse Practitioner

## 2024-09-01 DIAGNOSIS — Z1231 Encounter for screening mammogram for malignant neoplasm of breast: Secondary | ICD-10-CM

## 2024-09-30 ENCOUNTER — Inpatient Hospital Stay: Admitting: Internal Medicine

## 2024-09-30 ENCOUNTER — Other Ambulatory Visit: Payer: Self-pay

## 2024-09-30 ENCOUNTER — Inpatient Hospital Stay: Attending: Internal Medicine

## 2024-09-30 ENCOUNTER — Encounter: Payer: Self-pay | Admitting: Internal Medicine

## 2024-09-30 VITALS — BP 121/81 | HR 70 | Temp 96.3°F | Resp 16 | Ht <= 58 in | Wt 124.5 lb

## 2024-09-30 DIAGNOSIS — D473 Essential (hemorrhagic) thrombocythemia: Secondary | ICD-10-CM

## 2024-09-30 DIAGNOSIS — R161 Splenomegaly, not elsewhere classified: Secondary | ICD-10-CM | POA: Insufficient documentation

## 2024-09-30 DIAGNOSIS — Z79899 Other long term (current) drug therapy: Secondary | ICD-10-CM | POA: Insufficient documentation

## 2024-09-30 DIAGNOSIS — D75839 Thrombocytosis, unspecified: Secondary | ICD-10-CM | POA: Insufficient documentation

## 2024-09-30 DIAGNOSIS — E538 Deficiency of other specified B group vitamins: Secondary | ICD-10-CM | POA: Insufficient documentation

## 2024-09-30 DIAGNOSIS — Z86718 Personal history of other venous thrombosis and embolism: Secondary | ICD-10-CM | POA: Insufficient documentation

## 2024-09-30 LAB — CBC WITH DIFFERENTIAL (CANCER CENTER ONLY)
Abs Immature Granulocytes: 0.03 K/uL (ref 0.00–0.07)
Basophils Absolute: 0 K/uL (ref 0.0–0.1)
Basophils Relative: 1 %
Eosinophils Absolute: 0.2 K/uL (ref 0.0–0.5)
Eosinophils Relative: 4 %
HCT: 32.3 % — ABNORMAL LOW (ref 36.0–46.0)
Hemoglobin: 10.2 g/dL — ABNORMAL LOW (ref 12.0–15.0)
Immature Granulocytes: 1 %
Lymphocytes Relative: 17 %
Lymphs Abs: 0.8 K/uL (ref 0.7–4.0)
MCH: 27.6 pg (ref 26.0–34.0)
MCHC: 31.6 g/dL (ref 30.0–36.0)
MCV: 87.3 fL (ref 80.0–100.0)
Monocytes Absolute: 0.3 K/uL (ref 0.1–1.0)
Monocytes Relative: 6 %
Neutro Abs: 3.6 K/uL (ref 1.7–7.7)
Neutrophils Relative %: 71 %
Platelet Count: 475 K/uL — ABNORMAL HIGH (ref 150–400)
RBC: 3.7 MIL/uL — ABNORMAL LOW (ref 3.87–5.11)
RDW: 16.2 % — ABNORMAL HIGH (ref 11.5–15.5)
WBC Count: 5 K/uL (ref 4.0–10.5)
nRBC: 0 % (ref 0.0–0.2)

## 2024-09-30 LAB — CMP (CANCER CENTER ONLY)
ALT: 9 U/L (ref 0–44)
AST: 19 U/L (ref 15–41)
Albumin: 3.8 g/dL (ref 3.5–5.0)
Alkaline Phosphatase: 65 U/L (ref 38–126)
Anion gap: 10 (ref 5–15)
BUN: 21 mg/dL (ref 8–23)
CO2: 27 mmol/L (ref 22–32)
Calcium: 9.6 mg/dL (ref 8.9–10.3)
Chloride: 100 mmol/L (ref 98–111)
Creatinine: 0.98 mg/dL (ref 0.44–1.00)
GFR, Estimated: >60 mL/min (ref >60)
Glucose, Bld: 91 mg/dL (ref 70–99)
Potassium: 3.8 mmol/L (ref 3.5–5.1)
Sodium: 136 mmol/L (ref 135–145)
Total Bilirubin: 0.4 mg/dL (ref 0.0–1.2)
Total Protein: 7.8 g/dL (ref 6.5–8.1)

## 2024-09-30 LAB — FERRITIN: Ferritin: 30 ng/mL (ref 11–307)

## 2024-09-30 LAB — IRON AND TIBC
Iron: 34 ug/dL (ref 28–170)
Saturation Ratios: 11 % (ref 10.4–31.8)
TIBC: 302 ug/dL (ref 250–450)
UIBC: 268 ug/dL

## 2024-09-30 LAB — LACTATE DEHYDROGENASE: LDH: 166 U/L (ref 105–235)

## 2024-09-30 NOTE — Progress Notes (Signed)
 Fatigue: NO Bruising: NO Nose bleeds: NO Blood in your stool/heavy periods: NO Abdominal pain: NO Burning pain in the feet/hands/arms/legs/ears or face: NO

## 2024-09-30 NOTE — Progress Notes (Signed)
 [facility] CONSULT NOTE  Patient Care Team: Lise Almarie Ada, PA (Inactive) as PCP - General (Physician Assistant) Rennie Cindy SAUNDERS, MD as Consulting Physician (Oncology)  CHIEF COMPLAINTS/PURPOSE OF CONSULTATION: Essential thrombocytosis.  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   # Hx of Left LE DVT [in 1994]-    Essential thrombocythemia (HCC)  09/22/2021 Initial Diagnosis   Essential thrombocythemia (HCC)     HISTORY OF PRESENTING ILLNESS: Ambulating independently.  Accompanied with  interpreter.  Virginia Esparza 68 y.o.  female pleasant patient with history of JAK-2 positive ET on hydrea  and asprin is here for follow-up.  Discussed the use of AI scribe software for clinical note transcription with the patient, who gave verbal consent to proceed.  History of Present Illness   Virginia Esparza is a 68 year old female with essential thrombocythemia and splenomegaly who presents for routine hematology follow-up.  She remains on hydroxyurea , taking the medication five days per week with weekends off, and confirms adherence to this regimen. She continues low-dose aspirin. No new symptoms have developed since her last visit. She denies nausea, vomiting, diarrhea, and abdominal pain. Blood counts are gradually improving, with hemoglobin increasing from 9.6 to 10.2.  Her appetite is variable, with intermittent days of reduced appetite but no associated weight loss. Bowel function is normal.  She is not currently taking vitamin B12 supplementation; her B12 level was noted to be slightly low.  A prior scan at diagnosis revealed mild splenomegaly.        Review of Systems  Constitutional:  Negative for chills, diaphoresis,  fever, malaise/fatigue and weight loss.  HENT:  Negative for nosebleeds and sore throat.   Eyes:  Negative for double vision.  Respiratory:  Negative for cough, hemoptysis, sputum production, shortness of breath and wheezing.   Cardiovascular:  Negative for chest pain, palpitations, orthopnea and leg swelling.  Gastrointestinal:  Negative for abdominal pain, blood in stool, constipation, diarrhea, heartburn, melena, nausea and vomiting.  Genitourinary:  Negative for dysuria, frequency and urgency.  Musculoskeletal:  Positive for joint pain. Negative for back pain.  Skin: Negative.  Negative for itching and rash.  Neurological:  Negative for dizziness, tingling, focal weakness, weakness and headaches.  Endo/Heme/Allergies:  Does not bruise/bleed easily.  Psychiatric/Behavioral:  Negative for depression. The patient is not nervous/anxious and does not have insomnia.      MEDICAL HISTORY:  Past Medical History:  Diagnosis Date   Anemia    Diabetes mellitus without complication (HCC)    prediabetic   Gastric erosion    Gastric erythema    History of thrombocytosis    Hx of splenomegaly    Hyperlipemia    Hypertension    Iron deficiency anemia    Thrombocytopenia    Wears dentures    full upper    SURGICAL HISTORY: Past Surgical History:  Procedure Laterality Date   BIOPSY N/A 07/12/2022   Procedure: BIOPSY;  Surgeon: Unk Corinn Skiff, MD;  Location: [facility];  Service: Endoscopy;  Laterality: N/A;   CATARACT EXTRACTION W/PHACO Left 05/05/2024   Procedure: PHACOEMULSIFICATION, CATARACT, WITH IOL INSERTION 5.72 00:34.6;  Surgeon: Jaye Fallow, MD;  Location: [facility];  Service: Ophthalmology;  Laterality: Left;   CATARACT EXTRACTION W/PHACO Right 05/19/2024   Procedure: PHACOEMULSIFICATION, CATARACT, WITH IOL INSERTION 3.95  00:21.3;  Surgeon: Jaye Fallow, MD;  Location: [facility];  Service: Ophthalmology;  Laterality: Right;   CESAREAN  SECTION  1981   CESAREAN SECTION  1995   COLONOSCOPY WITH PROPOFOL  N/A 07/12/2022   Procedure: COLONOSCOPY WITH PROPOFOL ;  Surgeon: Unk Corinn Skiff, MD;  Location: [facility];  Service: Endoscopy;  Laterality: N/A;   ESOPHAGOGASTRODUODENOSCOPY (EGD) WITH PROPOFOL  N/A 07/12/2022   Procedure: ESOPHAGOGASTRODUODENOSCOPY (EGD) WITH PROPOFOL ;  Surgeon: Unk Corinn Skiff, MD;  Location: [facility];  Service: Endoscopy;  Laterality: N/A;   TOE SURGERY      SOCIAL HISTORY: Social History   Socioeconomic History   Marital status: Married    Spouse name: Not on file   Number of children: Not on file   Years of education: Not on file   Highest education level: Not on file  Occupational History   Not on file  Tobacco Use   Smoking status: Never   Smokeless tobacco: Never  Vaping Use   Vaping status: Never Used  Substance and Sexual Activity   Alcohol use: Never   Drug use: Never   Sexual activity: Yes  Other Topics Concern   Not on file  Social History 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 Lives in [place]- with husband/son.    Social Drivers of Health   Tobacco Use: Low Risk (09/30/2024)   Patient History    Smoking Tobacco Use: Never    Smokeless Tobacco Use: Never    Passive Exposure: Not on file  Financial Resource Strain: Not on file  Food Insecurity: Not on file  Transportation Needs: Not on file  Physical Activity: Not on file  Stress: Not on file  Social Connections: Not on file  Intimate Partner Violence: Not on file  Depression (PHQ2-9): Low Risk (09/30/2024)   Depression (PHQ2-9)    PHQ-2 Score: 0  Alcohol Screen: Not on file  Housing: Not on file  Utilities: Not on file  Health Literacy: Not on file    FAMILY HISTORY: Family History  Problem Relation Age of Onset   Diabetes Mother     ALLERGIES:  has no known allergies.  MEDICATIONS:  Current Outpatient Medications  Medication Sig Dispense Refill    aspirin 81 MG EC tablet Take 81 mg by mouth daily.     atorvastatin (LIPITOR) 40 MG tablet Take 40 mg by mouth daily as needed.     chlorthalidone (HYGROTON) 25 MG tablet Take 25 mg by mouth daily.     hydroxyurea  (HYDREA ) 500 MG capsule Take 1 capsule (500 mg total) by mouth daily. Take 1 pill daily Mon- Fri; Hold medication every Sat and Sun. May take with food to minimize GI side effects. 30 capsule 2   lisinopril (ZESTRIL) 40 MG tablet Take 40 mg by mouth daily.     metFORMIN (GLUCOPHAGE) 500 MG tablet Take 500 mg by mouth 2 (two) times daily.     No current facility-administered medications for this visit.     PHYSICAL EXAMINATION:   Vitals:   09/30/24 1328  BP: 121/81  Pulse: 70  Resp: 16  Temp: (!) 96.3 F (35.7 C)  SpO2: 100%    Filed Weights   09/30/24 1328  Weight: 124 lb 8 oz (56.5 kg)     Physical Exam Vitals and nursing note reviewed.  HENT:     Head: Normocephalic and atraumatic.     Mouth/Throat:     Pharynx: Oropharynx is clear.  Eyes:  Extraocular Movements: Extraocular movements intact.     Pupils: Pupils are equal, round, and reactive to light.  Cardiovascular:     Rate and Rhythm: Normal rate and regular rhythm.  Pulmonary:     Comments: Decreased breath sounds bilaterally.  Abdominal:     Palpations: Abdomen is soft.  Musculoskeletal:        General: Normal range of motion.     Cervical back: Normal range of motion.  Skin:    General: Skin is warm.  Neurological:     General: No focal deficit present.     Mental Status: She is alert and oriented to person, place, and time.  Psychiatric:        Behavior: Behavior normal.        Judgment: Judgment normal.      LABORATORY DATA:  I have reviewed the data as listed Lab Results  Component Value Date   WBC 5.0 09/30/2024   HGB 10.2 (L) 09/30/2024   HCT 32.3 (L) 09/30/2024   MCV 87.3 09/30/2024   PLT 475 (H) 09/30/2024   Recent Labs    02/25/24 0901 04/29/24 0949 09/30/24 1332   NA 139 137 136  K 3.8 3.7 3.8  CL 103 104 100  CO2 28 27 27   GLUCOSE 104* 99 91  BUN 19 19 21   CREATININE 1.04* 0.94 0.98  CALCIUM 8.6* 9.0 9.6  GFRNONAA 58* >60 >60  PROT 6.6 7.9 7.8  ALBUMIN 2.7* 3.4* 3.8  AST 45* 22 19  ALT 40 17 9  ALKPHOS 87 83 65  BILITOT 0.5 0.6 0.4     No results found.  ASSESSMENT & PLAN:   Essential thrombocythemia (HCC) # Essential thrombocytosis- JAK-2 POSITIVE. US - mild splenomegaly.  Currently on hydrea  [started Jan 2023]. On asprin 81 mg/day.   # as Hb is still in 9- recommend CONTINUE hydrea  500 mg/day; but NOT TAKE on SAT & SUNDAY.  SEP 2023 EGD/colonoscopies.  Low clinical concern for transformation-monitor closely.  No evidence of iron deficiency- I sat- 26; Ferritin-72; will repeat US  in 4 months/next visit [poor appetite mild weight loss]  # Mild B12 def- July 2025- 336- recommend b12 OTC.  Q32m- # DISPOSITION:  # add iron studies/ferritin to labs today # follow up in 4 months MD;  labs-cbc/cmp; -/LDH; B12 levels; US  prior- - --Dr.B          Cindy JONELLE Joe, MD 09/30/2024 2:25 PM

## 2024-09-30 NOTE — Addendum Note (Signed)
 Addended by: LAEL BROWNING A on: 09/30/2024 02:56 PM   Modules accepted: Orders

## 2024-09-30 NOTE — Patient Instructions (Signed)
#   Low B12 :  Recommend B12 sublingual 1000 mcg once a day [under the tongue; helps with better absorption].

## 2024-09-30 NOTE — Assessment & Plan Note (Addendum)
#   Essential thrombocytosis- JAK-2 POSITIVE. US - mild splenomegaly.  Currently on hydrea  [started Jan 2023]. On asprin 81 mg/day.   # as Hb is still in 9- recommend CONTINUE hydrea  500 mg/day; but NOT TAKE on SAT & SUNDAY.  SEP 2023 EGD/colonoscopies.  Low clinical concern for transformation-monitor closely.  No evidence of iron deficiency- I sat- 26; Ferritin-72; will repeat US  in 4 months/next visit [poor appetite mild weight loss]  # Mild B12 def- July 2025- 336- recommend b12 OTC.  Q38m- # DISPOSITION:  # add iron studies/ferritin to labs today # follow up in 4 months MD;  labs-cbc/cmp; -/LDH; B12 levels; US  prior- - --Dr.B

## 2024-10-16 ENCOUNTER — Ambulatory Visit
Admission: RE | Admit: 2024-10-16 | Discharge: 2024-10-16 | Disposition: A | Discharge status: Home / Self Care | Source: Ambulatory Visit | Attending: Nurse Practitioner | Admitting: Nurse Practitioner

## 2024-10-16 DIAGNOSIS — Z1231 Encounter for screening mammogram for malignant neoplasm of breast: Secondary | ICD-10-CM | POA: Diagnosis present

## 2024-10-21 ENCOUNTER — Other Ambulatory Visit: Payer: Self-pay | Admitting: Nurse Practitioner

## 2024-10-21 DIAGNOSIS — R928 Other abnormal and inconclusive findings on diagnostic imaging of breast: Secondary | ICD-10-CM

## 2024-10-22 ENCOUNTER — Ambulatory Visit
Admission: RE | Admit: 2024-10-22 | Discharge: 2024-10-22 | Disposition: A | Discharge status: Home / Self Care | Source: Ambulatory Visit | Attending: Nurse Practitioner | Admitting: Nurse Practitioner

## 2024-10-22 DIAGNOSIS — R928 Other abnormal and inconclusive findings on diagnostic imaging of breast: Secondary | ICD-10-CM

## 2024-10-26 ENCOUNTER — Other Ambulatory Visit: Payer: Self-pay | Admitting: Internal Medicine

## 2025-01-21 ENCOUNTER — Ambulatory Visit

## 2025-02-04 ENCOUNTER — Inpatient Hospital Stay

## 2025-02-04 ENCOUNTER — Inpatient Hospital Stay: Admitting: Internal Medicine
